# Patient Record
Sex: Female | Born: 1975 | Race: White | Hispanic: No | Marital: Single | Smoking: Former smoker
Health system: Southern US, Community
[De-identification: ages and names within clinical notes are randomized; demographics above are authoritative.]

## PROBLEM LIST (undated history)

## (undated) DIAGNOSIS — M5416 Radiculopathy, lumbar region: Secondary | ICD-10-CM

## (undated) DIAGNOSIS — T8859XA Other complications of anesthesia, initial encounter: Secondary | ICD-10-CM

## (undated) DIAGNOSIS — IMO0002 Reserved for concepts with insufficient information to code with codable children: Secondary | ICD-10-CM

## (undated) DIAGNOSIS — F419 Anxiety disorder, unspecified: Secondary | ICD-10-CM

## (undated) DIAGNOSIS — I10 Essential (primary) hypertension: Secondary | ICD-10-CM

## (undated) DIAGNOSIS — J45909 Unspecified asthma, uncomplicated: Secondary | ICD-10-CM

## (undated) DIAGNOSIS — T7840XA Allergy, unspecified, initial encounter: Secondary | ICD-10-CM

## (undated) DIAGNOSIS — D1800 Hemangioma unspecified site: Secondary | ICD-10-CM

## (undated) DIAGNOSIS — F40240 Claustrophobia: Secondary | ICD-10-CM

## (undated) DIAGNOSIS — M542 Cervicalgia: Secondary | ICD-10-CM

## (undated) DIAGNOSIS — F17200 Nicotine dependence, unspecified, uncomplicated: Secondary | ICD-10-CM

## (undated) DIAGNOSIS — K219 Gastro-esophageal reflux disease without esophagitis: Secondary | ICD-10-CM

## (undated) DIAGNOSIS — M25569 Pain in unspecified knee: Secondary | ICD-10-CM

## (undated) DIAGNOSIS — M1990 Unspecified osteoarthritis, unspecified site: Secondary | ICD-10-CM

## (undated) DIAGNOSIS — F411 Generalized anxiety disorder: Secondary | ICD-10-CM

## (undated) DIAGNOSIS — F329 Major depressive disorder, single episode, unspecified: Secondary | ICD-10-CM

## (undated) DIAGNOSIS — E785 Hyperlipidemia, unspecified: Secondary | ICD-10-CM

## (undated) DIAGNOSIS — R079 Chest pain, unspecified: Secondary | ICD-10-CM

## (undated) DIAGNOSIS — F32A Depression, unspecified: Secondary | ICD-10-CM

## (undated) DIAGNOSIS — T4145XA Adverse effect of unspecified anesthetic, initial encounter: Secondary | ICD-10-CM

## (undated) DIAGNOSIS — E559 Vitamin D deficiency, unspecified: Secondary | ICD-10-CM

## (undated) DIAGNOSIS — M549 Dorsalgia, unspecified: Secondary | ICD-10-CM

## (undated) HISTORY — DX: Reserved for concepts with insufficient information to code with codable children: IMO0002

## (undated) HISTORY — PX: CHOLECYSTECTOMY: SHX55

## (undated) HISTORY — DX: Hyperlipidemia, unspecified: E78.5

## (undated) HISTORY — DX: Pain in unspecified knee: M25.569

## (undated) HISTORY — PX: TUBAL LIGATION: SHX77

## (undated) HISTORY — DX: Hemangioma unspecified site: D18.00

## (undated) HISTORY — DX: Cervicalgia: M54.2

## (undated) HISTORY — PX: OVARIAN CYST REMOVAL: SHX89

## (undated) HISTORY — DX: Dorsalgia, unspecified: M54.9

## (undated) HISTORY — DX: Unspecified asthma, uncomplicated: J45.909

## (undated) HISTORY — DX: Anxiety disorder, unspecified: F41.9

## (undated) HISTORY — DX: Unspecified osteoarthritis, unspecified site: M19.90

## (undated) HISTORY — DX: Nicotine dependence, unspecified, uncomplicated: F17.200

## (undated) HISTORY — DX: Chest pain, unspecified: R07.9

## (undated) HISTORY — DX: Essential (primary) hypertension: I10

## (undated) HISTORY — DX: Major depressive disorder, single episode, unspecified: F32.9

## (undated) HISTORY — DX: Generalized anxiety disorder: F41.1

## (undated) HISTORY — PX: ABDOMINAL HYSTERECTOMY: SHX81

## (undated) HISTORY — DX: Gastro-esophageal reflux disease without esophagitis: K21.9

## (undated) HISTORY — DX: Allergy, unspecified, initial encounter: T78.40XA

## (undated) HISTORY — DX: Depression, unspecified: F32.A

## (undated) HISTORY — DX: Radiculopathy, lumbar region: M54.16

---

## 1998-10-14 ENCOUNTER — Inpatient Hospital Stay (HOSPITAL_COMMUNITY): Admission: AD | Admit: 1998-10-14 | Discharge: 1998-10-14 | Payer: Self-pay | Admitting: *Deleted

## 2000-12-11 ENCOUNTER — Encounter: Payer: Self-pay | Admitting: Emergency Medicine

## 2000-12-11 ENCOUNTER — Emergency Department (HOSPITAL_COMMUNITY): Admission: EM | Admit: 2000-12-11 | Discharge: 2000-12-11 | Payer: Self-pay | Admitting: Emergency Medicine

## 2001-01-06 ENCOUNTER — Ambulatory Visit (HOSPITAL_COMMUNITY): Admission: RE | Admit: 2001-01-06 | Discharge: 2001-01-06 | Payer: Self-pay | Admitting: *Deleted

## 2001-01-06 ENCOUNTER — Encounter: Payer: Self-pay | Admitting: Obstetrics and Gynecology

## 2001-01-06 ENCOUNTER — Other Ambulatory Visit: Admission: RE | Admit: 2001-01-06 | Discharge: 2001-01-06 | Payer: Self-pay | Admitting: Obstetrics and Gynecology

## 2001-05-27 ENCOUNTER — Emergency Department (HOSPITAL_COMMUNITY): Admission: EM | Admit: 2001-05-27 | Discharge: 2001-05-27 | Payer: Self-pay | Admitting: *Deleted

## 2001-12-24 ENCOUNTER — Encounter: Payer: Self-pay | Admitting: *Deleted

## 2001-12-24 ENCOUNTER — Emergency Department (HOSPITAL_COMMUNITY): Admission: EM | Admit: 2001-12-24 | Discharge: 2001-12-24 | Payer: Self-pay | Admitting: *Deleted

## 2002-05-27 ENCOUNTER — Emergency Department (HOSPITAL_COMMUNITY): Admission: EM | Admit: 2002-05-27 | Discharge: 2002-05-27 | Payer: Self-pay | Admitting: Emergency Medicine

## 2002-10-27 ENCOUNTER — Emergency Department (HOSPITAL_COMMUNITY): Admission: EM | Admit: 2002-10-27 | Discharge: 2002-10-28 | Payer: Self-pay | Admitting: Internal Medicine

## 2002-10-28 ENCOUNTER — Encounter: Payer: Self-pay | Admitting: Internal Medicine

## 2002-12-24 ENCOUNTER — Inpatient Hospital Stay (HOSPITAL_COMMUNITY): Admission: RE | Admit: 2002-12-24 | Discharge: 2002-12-26 | Payer: Self-pay | Admitting: Obstetrics and Gynecology

## 2004-03-29 ENCOUNTER — Emergency Department (HOSPITAL_COMMUNITY): Admission: EM | Admit: 2004-03-29 | Discharge: 2004-03-29 | Payer: Self-pay | Admitting: Emergency Medicine

## 2005-10-13 ENCOUNTER — Ambulatory Visit (HOSPITAL_COMMUNITY): Admission: RE | Admit: 2005-10-13 | Discharge: 2005-10-13 | Payer: Self-pay | Admitting: Obstetrics and Gynecology

## 2006-02-28 ENCOUNTER — Emergency Department (HOSPITAL_COMMUNITY): Admission: EM | Admit: 2006-02-28 | Discharge: 2006-02-28 | Payer: Self-pay | Admitting: Emergency Medicine

## 2006-06-29 ENCOUNTER — Emergency Department (HOSPITAL_COMMUNITY): Admission: EM | Admit: 2006-06-29 | Discharge: 2006-06-29 | Payer: Self-pay | Admitting: Emergency Medicine

## 2006-07-04 ENCOUNTER — Emergency Department (HOSPITAL_COMMUNITY): Admission: EM | Admit: 2006-07-04 | Discharge: 2006-07-04 | Payer: Self-pay | Admitting: Emergency Medicine

## 2007-08-23 ENCOUNTER — Emergency Department (HOSPITAL_COMMUNITY): Admission: EM | Admit: 2007-08-23 | Discharge: 2007-08-23 | Payer: Self-pay | Admitting: Emergency Medicine

## 2007-09-21 ENCOUNTER — Emergency Department (HOSPITAL_COMMUNITY): Admission: EM | Admit: 2007-09-21 | Discharge: 2007-09-21 | Payer: Self-pay | Admitting: Emergency Medicine

## 2007-11-13 ENCOUNTER — Emergency Department (HOSPITAL_COMMUNITY): Admission: EM | Admit: 2007-11-13 | Discharge: 2007-11-13 | Payer: Self-pay | Admitting: Emergency Medicine

## 2007-11-28 ENCOUNTER — Ambulatory Visit (HOSPITAL_COMMUNITY): Admission: RE | Admit: 2007-11-28 | Discharge: 2007-11-28 | Payer: Self-pay | Admitting: Orthopaedic Surgery

## 2008-03-01 ENCOUNTER — Encounter: Payer: Self-pay | Admitting: Orthopedic Surgery

## 2008-05-24 DIAGNOSIS — R079 Chest pain, unspecified: Secondary | ICD-10-CM

## 2008-05-24 HISTORY — DX: Chest pain, unspecified: R07.9

## 2008-06-11 ENCOUNTER — Emergency Department (HOSPITAL_COMMUNITY): Admission: EM | Admit: 2008-06-11 | Discharge: 2008-06-11 | Payer: Self-pay | Admitting: Emergency Medicine

## 2008-06-13 ENCOUNTER — Ambulatory Visit (HOSPITAL_COMMUNITY): Admission: RE | Admit: 2008-06-13 | Discharge: 2008-06-13 | Payer: Self-pay | Admitting: Family Medicine

## 2008-06-18 ENCOUNTER — Ambulatory Visit (HOSPITAL_COMMUNITY): Admission: RE | Admit: 2008-06-18 | Discharge: 2008-06-18 | Payer: Self-pay | Admitting: Family Medicine

## 2009-01-29 ENCOUNTER — Ambulatory Visit: Payer: Self-pay | Admitting: Cardiology

## 2009-01-30 ENCOUNTER — Inpatient Hospital Stay (HOSPITAL_COMMUNITY): Admission: AD | Admit: 2009-01-30 | Discharge: 2009-01-31 | Payer: Self-pay | Admitting: Cardiology

## 2009-01-30 ENCOUNTER — Ambulatory Visit: Payer: Self-pay | Admitting: Cardiovascular Disease

## 2009-01-31 DIAGNOSIS — M79609 Pain in unspecified limb: Secondary | ICD-10-CM

## 2009-01-31 HISTORY — PX: CARDIAC CATHETERIZATION: SHX172

## 2009-02-03 ENCOUNTER — Encounter: Payer: Self-pay | Admitting: Cardiovascular Disease

## 2009-02-03 ENCOUNTER — Ambulatory Visit: Payer: Self-pay | Admitting: Vascular Surgery

## 2009-02-03 ENCOUNTER — Emergency Department (HOSPITAL_COMMUNITY): Admission: EM | Admit: 2009-02-03 | Discharge: 2009-02-03 | Payer: Self-pay | Admitting: Emergency Medicine

## 2009-02-03 ENCOUNTER — Telehealth: Payer: Self-pay | Admitting: Cardiovascular Disease

## 2009-02-04 ENCOUNTER — Telehealth: Payer: Self-pay | Admitting: Cardiovascular Disease

## 2009-02-04 ENCOUNTER — Encounter (INDEPENDENT_AMBULATORY_CARE_PROVIDER_SITE_OTHER): Payer: Self-pay

## 2009-02-05 ENCOUNTER — Telehealth: Payer: Self-pay | Admitting: Cardiovascular Disease

## 2009-02-05 DIAGNOSIS — M25569 Pain in unspecified knee: Secondary | ICD-10-CM | POA: Insufficient documentation

## 2009-02-05 DIAGNOSIS — F17200 Nicotine dependence, unspecified, uncomplicated: Secondary | ICD-10-CM

## 2009-02-05 DIAGNOSIS — Z8659 Personal history of other mental and behavioral disorders: Secondary | ICD-10-CM

## 2009-02-05 DIAGNOSIS — R079 Chest pain, unspecified: Secondary | ICD-10-CM

## 2009-02-05 DIAGNOSIS — Z8679 Personal history of other diseases of the circulatory system: Secondary | ICD-10-CM | POA: Insufficient documentation

## 2009-02-11 ENCOUNTER — Ambulatory Visit: Payer: Self-pay | Admitting: Cardiovascular Disease

## 2009-02-20 ENCOUNTER — Ambulatory Visit: Payer: Self-pay | Admitting: Cardiovascular Disease

## 2009-07-04 ENCOUNTER — Ambulatory Visit (HOSPITAL_COMMUNITY): Admission: RE | Admit: 2009-07-04 | Discharge: 2009-07-04 | Payer: Self-pay | Admitting: Family Medicine

## 2009-10-06 ENCOUNTER — Ambulatory Visit (HOSPITAL_COMMUNITY): Admission: RE | Admit: 2009-10-06 | Discharge: 2009-10-06 | Payer: Self-pay | Admitting: Family Medicine

## 2010-06-02 ENCOUNTER — Ambulatory Visit (HOSPITAL_COMMUNITY)
Admission: RE | Admit: 2010-06-02 | Discharge: 2010-06-02 | Payer: Self-pay | Source: Home / Self Care | Attending: Family Medicine | Admitting: Family Medicine

## 2010-06-14 ENCOUNTER — Encounter: Payer: Self-pay | Admitting: Family Medicine

## 2010-07-07 ENCOUNTER — Emergency Department (HOSPITAL_COMMUNITY)
Admission: EM | Admit: 2010-07-07 | Discharge: 2010-07-07 | Disposition: A | Discharge status: Home / Self Care | Payer: Medicaid Other | Attending: Emergency Medicine | Admitting: Emergency Medicine

## 2010-07-07 DIAGNOSIS — IMO0002 Reserved for concepts with insufficient information to code with codable children: Secondary | ICD-10-CM | POA: Insufficient documentation

## 2010-07-07 DIAGNOSIS — X503XXA Overexertion from repetitive movements, initial encounter: Secondary | ICD-10-CM | POA: Insufficient documentation

## 2010-07-14 ENCOUNTER — Other Ambulatory Visit (HOSPITAL_COMMUNITY): Payer: Self-pay | Admitting: Family Medicine

## 2010-07-14 DIAGNOSIS — M5090 Cervical disc disorder, unspecified, unspecified cervical region: Secondary | ICD-10-CM

## 2010-07-14 DIAGNOSIS — M542 Cervicalgia: Secondary | ICD-10-CM

## 2010-07-15 ENCOUNTER — Ambulatory Visit (HOSPITAL_COMMUNITY)
Admission: RE | Admit: 2010-07-15 | Discharge: 2010-07-15 | Disposition: A | Discharge status: Home / Self Care | Payer: Medicaid Other | Source: Ambulatory Visit | Attending: Family Medicine | Admitting: Family Medicine

## 2010-07-15 DIAGNOSIS — M5380 Other specified dorsopathies, site unspecified: Secondary | ICD-10-CM | POA: Insufficient documentation

## 2010-07-15 DIAGNOSIS — M542 Cervicalgia: Secondary | ICD-10-CM | POA: Insufficient documentation

## 2010-07-15 DIAGNOSIS — M79609 Pain in unspecified limb: Secondary | ICD-10-CM | POA: Insufficient documentation

## 2010-07-15 DIAGNOSIS — M5020 Other cervical disc displacement, unspecified cervical region: Secondary | ICD-10-CM | POA: Insufficient documentation

## 2010-07-15 DIAGNOSIS — M5090 Cervical disc disorder, unspecified, unspecified cervical region: Secondary | ICD-10-CM

## 2010-08-04 ENCOUNTER — Ambulatory Visit (HOSPITAL_COMMUNITY): Payer: Medicaid Other | Admitting: Physical Therapy

## 2010-08-04 DIAGNOSIS — M542 Cervicalgia: Secondary | ICD-10-CM | POA: Insufficient documentation

## 2010-08-04 DIAGNOSIS — IMO0001 Reserved for inherently not codable concepts without codable children: Secondary | ICD-10-CM | POA: Insufficient documentation

## 2010-08-04 DIAGNOSIS — M6281 Muscle weakness (generalized): Secondary | ICD-10-CM | POA: Insufficient documentation

## 2010-08-13 ENCOUNTER — Ambulatory Visit (HOSPITAL_COMMUNITY)
Admission: RE | Admit: 2010-08-13 | Discharge: 2010-08-13 | Disposition: A | Discharge status: Home / Self Care | Payer: Medicaid Other | Source: Ambulatory Visit | Attending: Neurosurgery | Admitting: Neurosurgery

## 2010-08-18 ENCOUNTER — Ambulatory Visit (HOSPITAL_COMMUNITY): Payer: Medicaid Other | Admitting: *Deleted

## 2010-08-20 ENCOUNTER — Ambulatory Visit (HOSPITAL_COMMUNITY): Payer: Medicaid Other

## 2010-08-25 ENCOUNTER — Ambulatory Visit (HOSPITAL_COMMUNITY): Payer: Medicaid Other

## 2010-08-27 ENCOUNTER — Ambulatory Visit (HOSPITAL_COMMUNITY): Payer: Medicaid Other | Admitting: *Deleted

## 2010-08-28 LAB — BASIC METABOLIC PANEL
Calcium: 8.5 mg/dL (ref 8.4–10.5)
GFR calc Af Amer: >60 mL/min (ref >60)
GFR calc non Af Amer: >60 mL/min (ref >60)
Sodium: 138 mEq/L (ref 135–145)

## 2010-08-28 LAB — LIPID PANEL
Cholesterol: 172 mg/dL (ref 0–200)
Triglycerides: 89 mg/dL (ref <150)
VLDL: 18 mg/dL (ref 0–40)

## 2010-08-28 LAB — PROTIME-INR: INR: 1.1 (ref 0.00–1.49)

## 2010-09-07 LAB — BASIC METABOLIC PANEL
BUN: 7 mg/dL (ref 6–23)
CO2: 26 mEq/L (ref 19–32)
Chloride: 106 mEq/L (ref 96–112)
GFR calc non Af Amer: >60 mL/min (ref >60)
Glucose, Bld: 87 mg/dL (ref 70–99)
Potassium: 3.8 mEq/L (ref 3.5–5.1)
Sodium: 139 mEq/L (ref 135–145)

## 2010-09-07 LAB — URINALYSIS, ROUTINE W REFLEX MICROSCOPIC
Bilirubin Urine: NEGATIVE
Glucose, UA: NEGATIVE mg/dL
Ketones, ur: NEGATIVE mg/dL
Leukocytes, UA: NEGATIVE
Specific Gravity, Urine: 1.01 (ref 1.005–1.030)
Urobilinogen, UA: 0.2 mg/dL (ref 0.0–1.0)
pH: 7.5 (ref 5.0–8.0)

## 2010-09-07 LAB — CBC
MCV: 92.2 fL (ref 78.0–100.0)
RDW: 13 % (ref 11.5–15.5)

## 2010-09-07 LAB — DIFFERENTIAL
Basophils Absolute: 0 10*3/uL (ref 0.0–0.1)
Eosinophils Absolute: 0.1 10*3/uL (ref 0.0–0.7)
Lymphs Abs: 1.1 10*3/uL (ref 0.7–4.0)
Monocytes Absolute: 0.3 10*3/uL (ref 0.1–1.0)
Monocytes Relative: 6 % (ref 3–12)
Neutrophils Relative %: 70 % (ref 43–77)

## 2010-09-07 LAB — POCT CARDIAC MARKERS
CKMB, poc: <1 ng/mL — ABNORMAL LOW (ref 1.0–8.0)
Troponin i, poc: <0.05 ng/mL (ref 0.00–0.09)

## 2010-09-07 LAB — PROTIME-INR: Prothrombin Time: 14.3 seconds (ref 11.6–15.2)

## 2010-10-09 NOTE — Discharge Summary (Signed)
   NAMESHERMAINE, Caitlyn Ray                         ACCOUNT NO.:  [id number]   MEDICAL RECORD NO.:  [id number]                   PATIENT TYPE:  INP   LOCATION:  A427                                 FACILITY:  [facility]   PHYSICIAN:  Tilda Burrow, M.D.              DATE OF BIRTH:  [birth year]   DATE OF ADMISSION:  12/24/2002  DATE OF DISCHARGE:  12/26/2002                                 DISCHARGE SUMMARY   ADMISSION DIAGNOSES:  1. Pelvic pain.  2. Uterine retroversion.  3. Mild cervical dysplasia.   DISCHARGE DIAGNOSES:  1. Pelvic pain.  2. Uterine retroversion.  3. Mild cervical dysplasia.   PROCEDURE:  On December 24, 2002, vaginal hysterectomy.   DISCHARGE MEDICATIONS:  1. Tylox one q.4h. p.r.n. pain., dispense 15.  2. Motrin 200 mg two q.4h. p.r.n. pain.  3. Laxative of choice p.r.n.   SPECIAL INSTRUCTIONS:  Temperature check twice daily with routine postop  instructions.   HOSPITAL COURSE:  This 35 year old female was admitted for vaginal  hysterectomy for the aforementioned complaints.  The patient underwent a  straight forward vaginal hysterectomy with reservation of ovaries on December 24, 2002, with 100 cc blood loss.  The patient sought and received a regular  diet that evening, but then became nauseated.  She had mild vomiting.  There  was some increased amount of discomfort after the vomiting episode.  Postoperative hemoglobin was 13.0, hematocrit 37.2 as compared to the  admitting hemoglobin of 14.7 and 42.2, not unreasonable for the surgical  procedure involved.  She was kept an additional 24 hours and discharged on  December 26, 2002, doing well, active, ambulatory and afebrile with resumption  of passage of gas and normal bowel function.  She will be discharged and  followed up on January 04, 2003.  The patient wishes early return to her job  at a [employer] in [place].  Will consider no less than a two-week  postpartum out of work status.                       Tilda Burrow, M.D.    JVF/MEDQ  D:  12/26/2002  T:  12/26/2002  Job:  [id number]

## 2010-10-09 NOTE — H&P (Signed)
NAME:  Caitlyn Ray, Caitlyn Ray                         ACCOUNT NO.:  [id number]   MEDICAL RECORD NO.:  [id number]                   PATIENT TYPE:  AMB   LOCATION:  DAY                                  FACILITY:  [facility]   PHYSICIAN:  Tilda Burrow, M.D.              DATE OF BIRTH:  1976-04-14   DATE OF ADMISSION:  12/24/2002  DATE OF DISCHARGE:                                HISTORY & PHYSICAL   ADMISSION DIAGNOSES:  1. Pelvic pain.  2. Uterine retroversion.  3. Mild cervical dysplasia.   HISTORY OF PRESENT ILLNESS:  This 35 year old female status post tubal  ligation and no plans for future childbearing is admitted at this time for  vaginal hysterectomy with preservation of ovaries.  She missed work three  days of the last cycle.  Menses increased dramatically since her tubal  ligation in 2001.  She now has menses lasting three to nine days.  She has  longstanding history of uterine retroversion and dysmenorrhea.  Pain has  been present for greater than a year.   She was seen in the emergency room once for right lower quadrant pain  recently which was considered of GYN origin.  CT scan at that time showed  small symptomatic ovarian cysts.  The uterus, however, is more significantly  retroverted.  On followup exam in the office, the adnexa were nontender.  The uterus was bulbous, retroverted, 2+ tender to contact anywhere along it.  At the time of that visit, she was 21 days into her cycle and then was in  the luteal phase.  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  In discussion of options, I made it clear that  hysterectomy was the patient's preferred choice if colposcopy was deferred.  There is no significant suspicion of cervical neoplasia.   PAST MEDICAL HISTORY:  None.   ALLERGIES:  ASPIRIN causes nausea and vomiting.  PENICILLIN causes rash.   PAST SURGICAL HISTORY:  Tubal ligation and laparoscopic right ovarian  cystectomy, Dr.  Lisette Grinder.   Note to Anesthesia:  The patient has a claustrophobic response to mask  oxygenation and requests that mask not be used during recovery due to that  claustrophobic feeling.   Risks of the procedure and technical aspect using [employer]  Booklet as visual guide with specific mention of bleeding, infection, injury  to adjacent organs such as bladder, ureter, or bowel has been discussed.  The patient understands that. She signed Medicaid consent forms  acknowledging the inability to childbear after hysterectomy.   PHYSICAL EXAMINATION:  VITAL SIGNS:  Height 5 feet 9-1/2 inches, weight 200.  Blood pressure 124/72, pulse 70.  HEENT:  Pupils are equal, round, and reactive.  NECK:  Supple.  Trachea midline.  CHEST:  Clear to auscultation.  ABDOMEN:  Nontender.  Moderate obesity.  PELVIC:  External genitalia good perineal support.  Vaginal exam: Posterior  oriented vagina with bulbous retroverted cervix and uterus.  No adnexal  masses.  Uterus tender to contact.   LABORATORY DATA:  Vaginal ultrasound shows small, normal, functional cyst  within the ovaries.  Adnexa otherwise negative.   PLAN:  Vaginal hysterectomy with preservation of ovaries December 24, 2002.                                               Tilda Burrow, M.D.    JVF/MEDQ  D:  12/21/2002  T:  12/21/2002  Job:  [id number]

## 2010-10-09 NOTE — Op Note (Signed)
Caitlyn Ray, Caitlyn Ray                         ACCOUNT NO.:  [id number]   MEDICAL RECORD NO.:  [id number]                   PATIENT TYPE:  INP   LOCATION:  A427                                 FACILITY:  [facility]   PHYSICIAN:  Tilda Burrow, M.D.              DATE OF BIRTH:  1976/02/08   DATE OF PROCEDURE:  12/24/2002  DATE OF DISCHARGE:  12/26/2002                                 OPERATIVE REPORT   PREOPERATIVE DIAGNOSES:  1. Pelvic pain.  2. Uterine retroversion.  3. Mild cervical dysplasia.   POSTOPERATIVE DIAGNOSES:  1. Pelvic pain.  2. Uterine retroversion.  3. Mild cervical dysplasia.   PROCEDURE:  Vaginal hysterectomy.   SURGEON:  Tilda Burrow, M.D.   ASSISTANTS:  Nino Parsley.   ANESTHESIA:  General.   COMPLICATIONS:  None.   FINDINGS:  Lax pelvic structures.  Marked descent of tubes and ovaries.   ESTIMATED BLOOD LOSS:  100 mL.   DESCRIPTION OF PROCEDURE:  The patient was taken to the operating room and  prepped and draped for a vaginal procedure.  Anterior and posterior  colpotomy were performed without difficulty with Marcaine and epinephrine  infiltrated into the anterior cervicovaginal fornix to improve hemostasis.  The long weighted speculum was inserted in the posterior colpotomy incision  and the uterosacral ligaments on either side clamped, cut, and suture  ligated using curved blunt clamps, Mayo scissors, and 0 chromic suture  ligature with tagging of the pedicles.  The lower cardinal ligaments were  then taken down, clamped, cut, and suture ligated on either side with  Zeppelin clamps, Mayo scissors, and 0 chromic suture ligatures described  earlier.  At this point we marched up the upper cardinal ligaments and the  broad ligament in serial small bites using similar technique.  Upon reaching  the level of the utero-ovarian ligament and round ligament, we were able to  grasp the pedicle on each side, amputate the uterus, and easily access  these  pedicles for double ligature.  The ovaries tended to fall down in place  markedly.  We made sure that the pedicles were able to retract upward and  after inspection for hemostasis, they were released and allowed to pull  further up into the abdomen.  The long speculum was converted to the three-  inch speculum, the uterosacral ligaments pulled together in the midline with  a suture of 3-0 nylon, the pelvic peritoneum closed with continuous running  2-0 chromic above the uterosacral ligament suture, then the uterosacral  ligament suture tied down and then the cuff closed in the midline, pulling  the cardinal ligament remnants together in the midline.  The anterior and  posterior edge of the hysterectomy incision were trimmed to result in a  smoother vaginal apex.  The vaginal cuff support was excellent, hemostasis  was sufficient that packing was not necessary.  A Foley catheter was  inserted.  Tilda Burrow, M.D.    JVF/MEDQ  D:  12/26/2002  T:  12/26/2002  Job:  [id number]

## 2010-12-23 ENCOUNTER — Other Ambulatory Visit: Payer: Self-pay

## 2010-12-23 ENCOUNTER — Encounter: Payer: Self-pay | Admitting: *Deleted

## 2010-12-23 DIAGNOSIS — Z9079 Acquired absence of other genital organ(s): Secondary | ICD-10-CM | POA: Insufficient documentation

## 2010-12-23 DIAGNOSIS — R1013 Epigastric pain: Secondary | ICD-10-CM | POA: Insufficient documentation

## 2010-12-23 DIAGNOSIS — R079 Chest pain, unspecified: Secondary | ICD-10-CM | POA: Insufficient documentation

## 2010-12-23 DIAGNOSIS — F17200 Nicotine dependence, unspecified, uncomplicated: Secondary | ICD-10-CM | POA: Insufficient documentation

## 2010-12-23 DIAGNOSIS — R61 Generalized hyperhidrosis: Secondary | ICD-10-CM | POA: Insufficient documentation

## 2010-12-23 NOTE — ED Notes (Signed)
Pain started while at work

## 2010-12-24 ENCOUNTER — Emergency Department (HOSPITAL_COMMUNITY)
Admission: EM | Admit: 2010-12-24 | Discharge: 2010-12-24 | Disposition: A | Discharge status: Home / Self Care | Payer: Self-pay | Attending: Emergency Medicine | Admitting: Emergency Medicine

## 2010-12-24 ENCOUNTER — Emergency Department (HOSPITAL_COMMUNITY): Payer: Self-pay

## 2010-12-24 DIAGNOSIS — R1013 Epigastric pain: Secondary | ICD-10-CM

## 2010-12-24 LAB — CBC
Hemoglobin: 13.8 g/dL (ref 12.0–15.0)
MCHC: 34.8 g/dL (ref 30.0–36.0)
WBC: 8.1 10*3/uL (ref 4.0–10.5)

## 2010-12-24 LAB — COMPREHENSIVE METABOLIC PANEL
BUN: 10 mg/dL (ref 6–23)
Calcium: 9.9 mg/dL (ref 8.4–10.5)
GFR calc Af Amer: >60 mL/min (ref >60)
Glucose, Bld: 109 mg/dL — ABNORMAL HIGH (ref 70–99)
Total Protein: 7.5 g/dL (ref 6.0–8.3)

## 2010-12-24 LAB — URINALYSIS, ROUTINE W REFLEX MICROSCOPIC
Leukocytes, UA: NEGATIVE
Nitrite: NEGATIVE
Specific Gravity, Urine: 1.025 (ref 1.005–1.030)
pH: 6 (ref 5.0–8.0)

## 2010-12-24 LAB — URINE MICROSCOPIC-ADD ON

## 2010-12-24 LAB — CARDIAC PANEL(CRET KIN+CKTOT+MB+TROPI)
CK, MB: 1.5 ng/mL (ref 0.3–4.0)
Troponin I: <0.3 ng/mL (ref <0.30)

## 2010-12-24 LAB — DIFFERENTIAL
Basophils Absolute: 0.1 10*3/uL (ref 0.0–0.1)
Basophils Relative: 1 % (ref 0–1)
Lymphocytes Relative: 37 % (ref 12–46)
Monocytes Relative: 6 % (ref 3–12)
Neutro Abs: 4.4 10*3/uL (ref 1.7–7.7)
Neutrophils Relative %: 54 % (ref 43–77)

## 2010-12-24 MED ORDER — ONDANSETRON HCL 4 MG/2ML IJ SOLN
4.0000 mg | Freq: Once | INTRAMUSCULAR | Status: AC
  Administered 2010-12-24: 4 mg via INTRAVENOUS
  Filled 2010-12-24: qty 2

## 2010-12-24 MED ORDER — TRAMADOL HCL 50 MG PO TABS: 50.0000 mg | ORAL_TABLET | Freq: Four times a day (QID) | ORAL | Status: AC | PRN

## 2010-12-24 MED ORDER — SODIUM CHLORIDE 0.9 % IV SOLN
Freq: Once | INTRAVENOUS | Status: AC
  Administered 2010-12-24: 02:00:00 via INTRAVENOUS

## 2010-12-24 MED ORDER — IOHEXOL 300 MG/ML  SOLN
100.0000 mL | Freq: Once | INTRAMUSCULAR | Status: AC | PRN
  Administered 2010-12-24: 100 mL via INTRAVENOUS

## 2010-12-24 MED ORDER — HYDROMORPHONE HCL 1 MG/ML IJ SOLN
1.0000 mg | Freq: Once | INTRAMUSCULAR | Status: AC
  Administered 2010-12-24: 1 mg via INTRAVENOUS
  Filled 2010-12-24: qty 1

## 2010-12-24 MED ORDER — RANITIDINE HCL 150 MG PO CAPS: 150.0000 mg | ORAL_CAPSULE | Freq: Two times a day (BID) | ORAL | Status: DC

## 2010-12-24 NOTE — ED Provider Notes (Addendum)
History     CSN: [id number] Arrival date & time: 12/24/2010 12:09 AM  Chief Complaint  Patient presents with  . Chest Pain    radiates to back   Patient is a 35 y.o. female presenting with chest pain. The history is provided by the patient.  Chest Pain The chest pain began yesterday (The patient complains of epigastric pain going into her back). Chest pain occurs frequently. The chest pain is unchanged. At its most intense, the pain is at 4/10. The pain is currently at 4/10. The quality of the pain is described as aching. Chest pain is worsened by certain positions. Primary symptoms include abdominal pain. Pertinent negatives for primary symptoms include no fever, no fatigue and no cough.  Associated symptoms include diaphoresis.  Pertinent negatives for past medical history include no seizures.     History reviewed. No pertinent past medical history.  Past Surgical History  Procedure Date  . Abdominal hysterectomy     History reviewed. No pertinent family history.  History  Substance Use Topics  . Smoking status: Current Everyday Smoker -- 1.0 packs/day    Types: Cigarettes  . Smokeless tobacco: Not on file  . Alcohol Use: No    OB History    Grav Para Term Preterm Abortions TAB SAB Ect Mult Living                  Review of Systems  Constitutional: Positive for diaphoresis. Negative for fever and fatigue.  HENT: Negative for congestion, sinus pressure and ear discharge.   Eyes: Negative for discharge.  Respiratory: Negative for cough.   Cardiovascular: Positive for chest pain.  Gastrointestinal: Positive for abdominal pain. Negative for diarrhea.  Genitourinary: Negative for frequency and hematuria.  Musculoskeletal: Negative for back pain.  Skin: Negative for rash.  Neurological: Negative for seizures and headaches.  Hematological: Negative.   Psychiatric/Behavioral: Negative for hallucinations.    Physical Exam  BP 127/78  Pulse 73  Temp(Src) 97.8 F (36.6  C) (Oral)  Resp 16  Ht 5\' 10"  (1.778 m)  Wt 234 lb (106.142 kg)  BMI 33.58 kg/m2  SpO2 95%  Physical Exam  Constitutional: She is oriented to person, place, and time. She appears well-developed.  HENT:  Head: Normocephalic and atraumatic.  Eyes: Conjunctivae and EOM are normal. No scleral icterus.  Neck: Neck supple. No thyromegaly present.  Cardiovascular: Normal rate and regular rhythm.  Exam reveals no gallop and no friction rub.   No murmur heard. Pulmonary/Chest: No stridor. She has no wheezes. She has no rales. She exhibits no tenderness.  Abdominal: She exhibits no distension. There is tenderness. There is no rebound.       Epigastric pain and tendernous  Musculoskeletal: Normal range of motion. She exhibits no edema.  Lymphadenopathy:    She has no cervical adenopathy.  Neurological: She is oriented to person, place, and time. Coordination normal.  Skin: No rash noted. No erythema.  Psychiatric: She has a normal mood and affect. Her behavior is normal.    ED Course  Procedures  MDM Epigastric pain,  Gastritis?  Gall stones?  Results for orders placed during the hospital encounter of 12/24/10  CBC      Component Value Range   WBC 8.1  4.0 - 10.5 (K/uL)   RBC 4.41  3.87 - 5.11 (MIL/uL)   Hemoglobin 13.8  12.0 - 15.0 (g/dL)   HCT 40.9  81.1 - 91.4 (%)   MCV 89.8  78.0 - 100.0 (fL)  MCH 31.3  26.0 - 34.0 (pg)   MCHC 34.8  30.0 - 36.0 (g/dL)   RDW 16.1  09.6 - 04.5 (%)   Platelets 160  150 - 400 (K/uL)  DIFFERENTIAL      Component Value Range   Neutrophils Relative 54  43 - 77 (%)   Neutro Abs 4.4  1.7 - 7.7 (K/uL)   Lymphocytes Relative 37  12 - 46 (%)   Lymphs Abs 3.0  0.7 - 4.0 (K/uL)   Monocytes Relative 6  3 - 12 (%)   Monocytes Absolute 0.5  0.1 - 1.0 (K/uL)   Eosinophils Relative 3  0 - 5 (%)   Eosinophils Absolute 0.2  0.0 - 0.7 (K/uL)   Basophils Relative 1  0 - 1 (%)   Basophils Absolute 0.1  0.0 - 0.1 (K/uL)  CARDIAC PANEL(CRET  KIN+CKTOT+MB+TROPI)      Component Value Range   Total CK 73  7 - 177 (U/L)   CK, MB 1.5  0.3 - 4.0 (ng/mL)   Troponin I <0.30  <0.30 (ng/mL)   Relative Index RELATIVE INDEX IS INVALID  0.0 - 2.5   LIPASE, BLOOD      Component Value Range   Lipase 36  11 - 59 (U/L)  COMPREHENSIVE METABOLIC PANEL      Component Value Range   Sodium 138  135 - 145 (mEq/L)   Potassium 3.7  3.5 - 5.1 (mEq/L)   Chloride 101  96 - 112 (mEq/L)   CO2 23  19 - 32 (mEq/L)   Glucose, Bld 109 (*) 70 - 99 (mg/dL)   BUN 10  6 - 23 (mg/dL)   Creatinine, Ser 4.09  0.50 - 1.10 (mg/dL)   Calcium 9.9  8.4 - 81.1 (mg/dL)   Total Protein 7.5  6.0 - 8.3 (g/dL)   Albumin 4.0  3.5 - 5.2 (g/dL)   AST 12  0 - 37 (U/L)   ALT 8  0 - 35 (U/L)   Alkaline Phosphatase 53  39 - 117 (U/L)   Total Bilirubin 0.4  0.3 - 1.2 (mg/dL)   GFR calc non Af Amer >60  >60 (mL/min)   GFR calc Af Amer >60  >60 (mL/min)  URINALYSIS, ROUTINE W REFLEX MICROSCOPIC      Component Value Range   Color, Urine AMBER (*) YELLOW    Appearance HAZY (*) CLEAR    Specific Gravity, Urine 1.025  1.005 - 1.030    pH 6.0  5.0 - 8.0    Glucose, UA NEGATIVE  NEGATIVE (mg/dL)   Hgb urine dipstick LARGE (*) NEGATIVE    Bilirubin Urine NEGATIVE  NEGATIVE    Ketones, ur NEGATIVE  NEGATIVE (mg/dL)   Protein, ur NEGATIVE  NEGATIVE (mg/dL)   Urobilinogen, UA 0.2  0.0 - 1.0 (mg/dL)   Nitrite NEGATIVE  NEGATIVE    Leukocytes, UA NEGATIVE  NEGATIVE   URINE MICROSCOPIC-ADD ON      Component Value Range   Squamous Epithelial / LPF MANY (*) RARE    WBC, UA 3-6  <3 (WBC/hpf)   RBC / HPF 7-10  <3 (RBC/hpf)   Bacteria, UA MANY (*) RARE    Ct Abdomen Pelvis W Contrast  12/24/2010  *RADIOLOGY REPORT*  Clinical Data: Chest pain  CT ABDOMEN AND PELVIS WITH CONTRAST  Technique:  Multidetector CT imaging of the abdomen and pelvis was performed following the standard protocol during bolus administration of intravenous contrast.  Contrast: 100 ml Omnipaque-300 IV  Comparison:  12/24/2001  Findings: Lung bases are clear.  Liver and gallbladder are normal. Pancreas and spleen are normal.  Kidneys are normal.  Negative for bowel obstruction or bowel thickening.  Appendix is normal.  No free fluid.  Negative for mass or adenopathy.  IMPRESSION: No acute abnormality.  Original Report Authenticated By: Camelia Phenes, M.D.   Dg Chest Portable 1 View  12/24/2010  *RADIOLOGY REPORT*  Clinical Data: Chest pain and short of breath  PORTABLE CHEST - 1 VIEW  Comparison: 06/18/2008  Findings: Heart size is normal and the vascularity is normal. Lungs are clear without infiltrate or effusion.  IMPRESSION: No active cardiopulmonary disease.  Original Report Authenticated By: Camelia Phenes, M.D.       Benny Lennert, MD 12/24/10 0451                              Date: 02/05/2011  Rate: 72  Rhythm: normal sinus rhythm  QRS Axis: normal  Intervals: normal  ST/T Wave abnormalities: normal  Conduction Disutrbances:none  Narrative Interpretation:   Old EKG Reviewed: none available    Benny Lennert, MD 02/05/11 [phone]

## 2010-12-24 NOTE — ED Notes (Signed)
Pt instructed not to drive due to medications given.  Pt verbalizes understanding and will be driven home by her daughter.

## 2011-02-16 LAB — BASIC METABOLIC PANEL
BUN: 6
CO2: 27
Chloride: 108
Creatinine, Ser: 0.68
GFR calc Af Amer: >60

## 2011-02-16 LAB — CBC
MCHC: 35.7
MCV: 90.1
RBC: 4.31

## 2011-02-16 LAB — DIFFERENTIAL
Basophils Relative: 1
Eosinophils Absolute: 0.2
Monocytes Relative: 8
Neutrophils Relative %: 58

## 2012-03-02 ENCOUNTER — Encounter: Payer: Self-pay | Admitting: Cardiovascular Disease

## 2012-09-12 ENCOUNTER — Other Ambulatory Visit (HOSPITAL_COMMUNITY): Payer: Self-pay | Admitting: Orthopedic Surgery

## 2012-09-12 DIAGNOSIS — M542 Cervicalgia: Secondary | ICD-10-CM

## 2012-09-15 ENCOUNTER — Ambulatory Visit (HOSPITAL_COMMUNITY)
Admission: RE | Admit: 2012-09-15 | Discharge: 2012-09-15 | Disposition: A | Discharge status: Home / Self Care | Payer: No Typology Code available for payment source | Source: Ambulatory Visit | Attending: Orthopedic Surgery | Admitting: Orthopedic Surgery

## 2012-09-15 ENCOUNTER — Other Ambulatory Visit (HOSPITAL_COMMUNITY): Payer: Self-pay | Admitting: Orthopedic Surgery

## 2012-09-15 DIAGNOSIS — M542 Cervicalgia: Secondary | ICD-10-CM

## 2012-09-15 DIAGNOSIS — M5380 Other specified dorsopathies, site unspecified: Secondary | ICD-10-CM | POA: Insufficient documentation

## 2012-09-15 DIAGNOSIS — R5381 Other malaise: Secondary | ICD-10-CM | POA: Insufficient documentation

## 2012-09-15 DIAGNOSIS — R209 Unspecified disturbances of skin sensation: Secondary | ICD-10-CM | POA: Insufficient documentation

## 2012-09-15 DIAGNOSIS — M25519 Pain in unspecified shoulder: Secondary | ICD-10-CM | POA: Insufficient documentation

## 2012-09-15 DIAGNOSIS — G9520 Unspecified cord compression: Secondary | ICD-10-CM

## 2012-11-27 ENCOUNTER — Other Ambulatory Visit (HOSPITAL_COMMUNITY): Payer: Self-pay | Admitting: Orthopedic Surgery

## 2012-11-27 DIAGNOSIS — M25511 Pain in right shoulder: Secondary | ICD-10-CM

## 2012-11-28 ENCOUNTER — Ambulatory Visit (HOSPITAL_COMMUNITY): Payer: No Typology Code available for payment source

## 2012-11-29 ENCOUNTER — Ambulatory Visit (HOSPITAL_COMMUNITY)
Admission: RE | Admit: 2012-11-29 | Discharge: 2012-11-29 | Disposition: A | Discharge status: Home / Self Care | Payer: No Typology Code available for payment source | Source: Ambulatory Visit | Attending: Orthopedic Surgery | Admitting: Orthopedic Surgery

## 2012-11-29 DIAGNOSIS — M25511 Pain in right shoulder: Secondary | ICD-10-CM

## 2012-11-29 DIAGNOSIS — M67919 Unspecified disorder of synovium and tendon, unspecified shoulder: Secondary | ICD-10-CM | POA: Insufficient documentation

## 2012-11-29 DIAGNOSIS — M25519 Pain in unspecified shoulder: Secondary | ICD-10-CM | POA: Insufficient documentation

## 2012-11-29 DIAGNOSIS — M719 Bursopathy, unspecified: Secondary | ICD-10-CM | POA: Insufficient documentation

## 2013-01-24 ENCOUNTER — Encounter (HOSPITAL_COMMUNITY): Payer: Self-pay

## 2013-01-25 NOTE — H&P (Signed)
  Caitlyn Ray is an 37 y.o. female.    Chief Complaint: right shoulder pain  HPI: Pt is a 37 y.o. female complaining of right shoulder pain for multiple months. Pain had continually increased since the beginning. X-rays in the clinic show labral tear and AC arthrosis. Pt has tried various conservative treatments which have failed to alleviate their symptoms, including therapy and injections. Various options are discussed with the patient. Risks, benefits and expectations were discussed with the patient. Patient understand the risks, benefits and expectations and wishes to proceed with surgery.   PCP:  Colette Ribas, MD  D/C Plans:  Home   PMH: Past Medical History  Diagnosis Date  . Chest pain, unspecified   . CVA (cerebral infarction)   . Tobacco use disorder   . Anxiety   . Knee pain     PSH: Past Surgical History  Procedure Laterality Date  . Abdominal hysterectomy    . Ovarian cyst removal      Social History:  reports that she has been smoking Cigarettes.  She has been smoking about 1.00 pack per day. She does not have any smokeless tobacco history on file. She reports that she does not drink alcohol or use illicit drugs.  Allergies:  Allergies  Allergen Reactions  . Aspirin Nausea And Vomiting  . Penicillins Hives  . Vancomycin Itching    Medications: No current facility-administered medications for this encounter.   Current Outpatient Prescriptions  Medication Sig Dispense Refill  . calcium carbonate (TUMS - DOSED IN MG ELEMENTAL CALCIUM) 500 MG chewable tablet Chew 1 tablet by mouth daily.      . cyclobenzaprine (FLEXERIL) 10 MG tablet Take 10 mg by mouth 3 (three) times daily as needed for muscle spasms.      Marland Kitchen HYDROcodone-acetaminophen (NORCO/VICODIN) 5-325 MG per tablet Take 1 tablet by mouth every 6 (six) hours as needed for pain (pain).      Marland Kitchen ibuprofen (ADVIL,MOTRIN) 200 MG tablet Take 600 mg by mouth every 6 (six) hours as needed.        .  nabumetone (RELAFEN) 500 MG tablet Take 500 mg by mouth 2 (two) times daily.        No results found for this or any previous visit (from the past 48 hour(s)). No results found.  ROS: Pain with rom of the right upper extremity  Physical Exam: Alert and oriented 37 y.o. 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 Ranell Patrick at [facility]. Risks benefits and expectations were discussed with the patient. Patient understand risks, benefits and expectations and wishes to proceed.

## 2013-01-29 ENCOUNTER — Encounter (HOSPITAL_COMMUNITY): Payer: Self-pay

## 2013-01-29 ENCOUNTER — Encounter (HOSPITAL_COMMUNITY)
Admission: RE | Admit: 2013-01-29 | Discharge: 2013-01-29 | Disposition: A | Discharge status: Home / Self Care | Payer: No Typology Code available for payment source | Source: Ambulatory Visit | Attending: Orthopedic Surgery | Admitting: Orthopedic Surgery

## 2013-01-29 DIAGNOSIS — Z01818 Encounter for other preprocedural examination: Secondary | ICD-10-CM | POA: Insufficient documentation

## 2013-01-29 DIAGNOSIS — Z01812 Encounter for preprocedural laboratory examination: Secondary | ICD-10-CM | POA: Insufficient documentation

## 2013-01-29 HISTORY — DX: Claustrophobia: F40.240

## 2013-01-29 HISTORY — DX: Adverse effect of unspecified anesthetic, initial encounter: T41.45XA

## 2013-01-29 HISTORY — DX: Other complications of anesthesia, initial encounter: T88.59XA

## 2013-01-29 LAB — CBC
HCT: 39.1 % (ref 36.0–46.0)
Hemoglobin: 14.3 g/dL (ref 12.0–15.0)
WBC: 9.1 10*3/uL (ref 4.0–10.5)

## 2013-01-29 NOTE — Progress Notes (Signed)
Patient informed Nurse that she had a stress test and EKG at [facility] in [place]. Will request records. After which patient informed Nurse that she had a cardiac cath at [facility] with Dr. Excell Seltzer. Patient denied having any acute cardiac or pulmonary issues. Patient stated "Since I've lost weight I feel a lot better....Marland KitchenMarland KitchenI used to be a big girl but I've lost weight."

## 2013-01-29 NOTE — Pre-Procedure Instructions (Signed)
Caitlyn Ray  01/29/2013   Your procedure is scheduled on:  Friday February 02, 2013.  Report to [facility] at 12:20 PM.  Call this number if you have problems the morning of surgery: [phone]   Remember:   Do not eat food or drink liquids after midnight.   Take these medicines the morning of surgery with A SIP OF WATER: Hydrocodone (Vicodin) if needed for pain   Do not wear jewelry, make-up or nail polish.  Do not wear lotions, powders, or perfumes. You may NOT wear deodorant.  Do not shave 48 hours prior to surgery.   Do not bring valuables to the hospital.  [facility] is not responsible for any belongings or valuables.  Contacts, dentures or bridgework may not be worn into surgery.  Leave suitcase in the car. After surgery it may be brought to your room.  For patients admitted to the hospital, checkout time is 11:00 AM the day of discharge.   Patients discharged the day of surgery will not be allowed to drive home.  Name and phone number of your driver: Family/Friend  Special Instructions: Shower using CHG 2 nights before surgery and the night before surgery.  If you shower the day of surgery use CHG.  Use special wash - you have one bottle of CHG for all showers.  You should use approximately 1/3 of the bottle for each shower.   Please read over the following fact sheets that you were given: Pain Booklet, Coughing and Deep Breathing and Surgical Site Infection Prevention

## 2013-01-29 NOTE — Progress Notes (Signed)
Nurse called Dr. Ranell Patrick office and spoke to "[place]" and informed her of patients allergy to Vancomycin. Olegario Messier stated she would send a message to Dr. Ranell Patrick.

## 2013-01-30 ENCOUNTER — Encounter (HOSPITAL_COMMUNITY): Payer: Self-pay

## 2013-01-30 NOTE — Progress Notes (Addendum)
Anesthesia chart review: Patient is a 37 year old female scheduled for right shoulder arthroscopy, subacromial decompression, open biceps tenodesis, open distal clavicle excision on 02/02/2013 by Dr. Ranell Patrick. History includes obesity, smoking, claustrophobia, anxiety, chest pain in 2010 with normal coronaries by cath, hysterectomy, cholecystectomy.  She reported SOB/anxiety with previous surgery related to face mask.  She had an Myoview at [facility] that suggested anterior wall ischemia and subsequently had a cardiac cath on 01/31/09 [facility]) that showed normal coronaries, normal LV function with EF 55-60%, no MR. (Copy of report found in E-chart and also found scanned under Media tab.)   Preoperative CBC noted.  She had normal coronaries by cath four years ago.  She does continue to smoke.  Further evaluation by her assigned anesthesiologist on the day of surgery, but would anticipate that she could proceed as planned.  Velna Ochs [facility]/Anesthesiology Phone [phone] 01/30/2013 10:52 AM

## 2013-02-01 MED ORDER — CLINDAMYCIN PHOSPHATE 900 MG/50ML IV SOLN
900.0000 mg | INTRAVENOUS | Status: AC
  Administered 2013-02-02: 900 mg via INTRAVENOUS
  Filled 2013-02-01: qty 50

## 2013-02-01 NOTE — Progress Notes (Signed)
Pt notified of time change;to arrive at 0915. 

## 2013-02-02 ENCOUNTER — Encounter (HOSPITAL_COMMUNITY): Payer: Self-pay | Admitting: Vascular Surgery

## 2013-02-02 ENCOUNTER — Encounter (HOSPITAL_COMMUNITY)
Admission: RE | Disposition: A | Discharge status: Home / Self Care | Payer: Self-pay | Source: Ambulatory Visit | Attending: Orthopedic Surgery

## 2013-02-02 ENCOUNTER — Ambulatory Visit (HOSPITAL_COMMUNITY): Payer: No Typology Code available for payment source | Admitting: Anesthesiology

## 2013-02-02 ENCOUNTER — Ambulatory Visit (HOSPITAL_COMMUNITY)
Admission: RE | Admit: 2013-02-02 | Discharge: 2013-02-02 | Disposition: A | Discharge status: Home / Self Care | Payer: No Typology Code available for payment source | Source: Ambulatory Visit | Attending: Orthopedic Surgery | Admitting: Orthopedic Surgery

## 2013-02-02 ENCOUNTER — Encounter (HOSPITAL_COMMUNITY): Payer: Self-pay | Admitting: *Deleted

## 2013-02-02 DIAGNOSIS — X58XXXA Exposure to other specified factors, initial encounter: Secondary | ICD-10-CM | POA: Insufficient documentation

## 2013-02-02 DIAGNOSIS — M19019 Primary osteoarthritis, unspecified shoulder: Secondary | ICD-10-CM | POA: Insufficient documentation

## 2013-02-02 DIAGNOSIS — Z79899 Other long term (current) drug therapy: Secondary | ICD-10-CM | POA: Insufficient documentation

## 2013-02-02 DIAGNOSIS — E669 Obesity, unspecified: Secondary | ICD-10-CM | POA: Insufficient documentation

## 2013-02-02 DIAGNOSIS — Z6836 Body mass index (BMI) 36.0-36.9, adult: Secondary | ICD-10-CM | POA: Insufficient documentation

## 2013-02-02 DIAGNOSIS — F17200 Nicotine dependence, unspecified, uncomplicated: Secondary | ICD-10-CM | POA: Insufficient documentation

## 2013-02-02 DIAGNOSIS — M25569 Pain in unspecified knee: Secondary | ICD-10-CM | POA: Insufficient documentation

## 2013-02-02 DIAGNOSIS — S43439A Superior glenoid labrum lesion of unspecified shoulder, initial encounter: Secondary | ICD-10-CM | POA: Insufficient documentation

## 2013-02-02 DIAGNOSIS — F411 Generalized anxiety disorder: Secondary | ICD-10-CM | POA: Insufficient documentation

## 2013-02-02 DIAGNOSIS — Z8673 Personal history of transient ischemic attack (TIA), and cerebral infarction without residual deficits: Secondary | ICD-10-CM | POA: Insufficient documentation

## 2013-02-02 HISTORY — PX: SHOULDER ARTHROSCOPY WITH SUBACROMIAL DECOMPRESSION AND OPEN ROTATOR C: SHX5688

## 2013-02-02 SURGERY — SHOULDER ARTHROSCOPY WITH SUBACROMIAL DECOMPRESSION AND OPEN ROTATOR CUFF REPAIR, OPEN BICEPS TENDON REPAIR
Anesthesia: General | Site: Shoulder | Laterality: Right | Wound class: Clean

## 2013-02-02 MED ORDER — NEOSTIGMINE METHYLSULFATE 1 MG/ML IJ SOLN
INTRAMUSCULAR | Status: DC | PRN
  Administered 2013-02-02: 4 mg via INTRAVENOUS

## 2013-02-02 MED ORDER — BUPIVACAINE-EPINEPHRINE 0.25% -1:200000 IJ SOLN
INTRAMUSCULAR | Status: DC | PRN
  Administered 2013-02-02: 7 mL

## 2013-02-02 MED ORDER — LACTATED RINGERS IV SOLN
INTRAVENOUS | Status: DC
  Administered 2013-02-02: 50 mL/h via INTRAVENOUS
  Administered 2013-02-02: 13:00:00 via INTRAVENOUS

## 2013-02-02 MED ORDER — MEPERIDINE HCL 25 MG/ML IJ SOLN: 6.2500 mg | INTRAMUSCULAR | Status: DC | PRN

## 2013-02-02 MED ORDER — CHLORHEXIDINE GLUCONATE 4 % EX LIQD: 60.0000 mL | Freq: Once | CUTANEOUS | Status: DC

## 2013-02-02 MED ORDER — MIDAZOLAM HCL 2 MG/2ML IJ SOLN
INTRAMUSCULAR | Status: AC
  Administered 2013-02-02: 1.5 mg via INTRAVENOUS
  Filled 2013-02-02: qty 2

## 2013-02-02 MED ORDER — PROMETHAZINE HCL 25 MG/ML IJ SOLN
INTRAMUSCULAR | Status: AC
  Filled 2013-02-02: qty 1

## 2013-02-02 MED ORDER — GLYCOPYRROLATE 0.2 MG/ML IJ SOLN
INTRAMUSCULAR | Status: DC | PRN
  Administered 2013-02-02: .8 mg via INTRAVENOUS

## 2013-02-02 MED ORDER — HYDROMORPHONE HCL 2 MG PO TABS: 2.0000 mg | ORAL_TABLET | ORAL | Status: DC | PRN

## 2013-02-02 MED ORDER — METHOCARBAMOL 500 MG PO TABS: 500.0000 mg | ORAL_TABLET | Freq: Three times a day (TID) | ORAL | Status: DC | PRN

## 2013-02-02 MED ORDER — OXYCODONE HCL 5 MG PO TABS: 5.0000 mg | ORAL_TABLET | Freq: Once | ORAL | Status: DC | PRN

## 2013-02-02 MED ORDER — FENTANYL CITRATE 0.05 MG/ML IJ SOLN
INTRAMUSCULAR | Status: DC | PRN
  Administered 2013-02-02 (×5): 50 ug via INTRAVENOUS

## 2013-02-02 MED ORDER — FENTANYL CITRATE 0.05 MG/ML IJ SOLN
INTRAMUSCULAR | Status: AC
  Administered 2013-02-02: 75 ug via INTRAVENOUS
  Filled 2013-02-02: qty 2

## 2013-02-02 MED ORDER — LIDOCAINE HCL (CARDIAC) 20 MG/ML IV SOLN
INTRAVENOUS | Status: DC | PRN
  Administered 2013-02-02: 50 mg via INTRAVENOUS

## 2013-02-02 MED ORDER — ROCURONIUM BROMIDE 100 MG/10ML IV SOLN
INTRAVENOUS | Status: DC | PRN
  Administered 2013-02-02: 50 mg via INTRAVENOUS

## 2013-02-02 MED ORDER — PROPOFOL 10 MG/ML IV BOLUS
INTRAVENOUS | Status: DC | PRN
  Administered 2013-02-02: 190 mg via INTRAVENOUS

## 2013-02-02 MED ORDER — PHENYLEPHRINE HCL 10 MG/ML IJ SOLN
INTRAMUSCULAR | Status: DC | PRN
  Administered 2013-02-02 (×7): 80 ug via INTRAVENOUS

## 2013-02-02 MED ORDER — OXYCODONE HCL 5 MG/5ML PO SOLN
5.0000 mg | Freq: Once | ORAL | Status: DC | PRN
Start: 2013-02-02 — End: 2013-02-02

## 2013-02-02 MED ORDER — SODIUM CHLORIDE 0.9 % IR SOLN
Status: DC | PRN
  Administered 2013-02-02 (×2): 3000 mL

## 2013-02-02 MED ORDER — MIDAZOLAM HCL 2 MG/2ML IJ SOLN: 0.5000 mg | Freq: Once | INTRAMUSCULAR | Status: DC | PRN

## 2013-02-02 MED ORDER — BUPIVACAINE-EPINEPHRINE PF 0.25-1:200000 % IJ SOLN
INTRAMUSCULAR | Status: AC
  Filled 2013-02-02: qty 30

## 2013-02-02 MED ORDER — PROMETHAZINE HCL 25 MG/ML IJ SOLN
6.2500 mg | INTRAMUSCULAR | Status: DC | PRN
  Administered 2013-02-02: 6.25 mg via INTRAVENOUS

## 2013-02-02 MED ORDER — HYDROMORPHONE HCL PF 1 MG/ML IJ SOLN: 0.2500 mg | INTRAMUSCULAR | Status: DC | PRN

## 2013-02-02 MED ORDER — ONDANSETRON HCL 4 MG/2ML IJ SOLN
INTRAMUSCULAR | Status: DC | PRN
  Administered 2013-02-02: 4 mg via INTRAVENOUS

## 2013-02-02 MED ORDER — BUPIVACAINE-EPINEPHRINE PF 0.5-1:200000 % IJ SOLN
INTRAMUSCULAR | Status: DC | PRN
  Administered 2013-02-02: 25 mL

## 2013-02-02 SURGICAL SUPPLY — 60 items
ANCH SUT 2 1.3X1 LD 1 STRN (Anchor) ×1 IMPLANT
ANCHOR ALL-SUT FLEX 1.3 Y-KNOT (Anchor) ×1 IMPLANT
BIT DRILL 1.3M DISPOSABLE (BIT) ×1 IMPLANT
BLADE AVERAGE 25X9 (BLADE) IMPLANT
BLADE LONG MED 31X9 (MISCELLANEOUS) ×1 IMPLANT
BLADE SURG 11 STRL SS (BLADE) ×2 IMPLANT
BUR OVAL 4.0 (BURR) IMPLANT
CLOTH BEACON ORANGE TIMEOUT ST (SAFETY) ×2 IMPLANT
COVER SURGICAL LIGHT HANDLE (MISCELLANEOUS) ×2 IMPLANT
DRAPE INCISE IOBAN 66X45 STRL (DRAPES) ×2 IMPLANT
DRAPE STERI 35X30 U-POUCH (DRAPES) ×2 IMPLANT
DRAPE U-SHAPE 47X51 STRL (DRAPES) ×2 IMPLANT
DRSG EMULSION OIL 3X3 NADH (GAUZE/BANDAGES/DRESSINGS) ×3 IMPLANT
DRSG PAD ABDOMINAL 8X10 ST (GAUZE/BANDAGES/DRESSINGS) ×3 IMPLANT
DURAPREP 26ML APPLICATOR (WOUND CARE) ×2 IMPLANT
ELECT NDL TIP 2.8 STRL (NEEDLE) IMPLANT
ELECT NEEDLE TIP 2.8 STRL (NEEDLE) ×2 IMPLANT
ELECT REM PT RETURN 9FT ADLT (ELECTROSURGICAL)
ELECTRODE REM PT RTRN 9FT ADLT (ELECTROSURGICAL) IMPLANT
GLOVE BIOGEL PI ORTHO PRO 7.5 (GLOVE) ×1
GLOVE BIOGEL PI ORTHO PRO SZ8 (GLOVE) ×1
GLOVE ORTHO TXT STRL SZ7.5 (GLOVE) ×2 IMPLANT
GLOVE PI ORTHO PRO STRL 7.5 (GLOVE) ×1 IMPLANT
GLOVE PI ORTHO PRO STRL SZ8 (GLOVE) ×1 IMPLANT
GLOVE SURG ORTHO 8.5 STRL (GLOVE) ×2 IMPLANT
GOWN STRL REIN XL XLG (GOWN DISPOSABLE) ×8 IMPLANT
KIT BASIN OR (CUSTOM PROCEDURE TRAY) ×2 IMPLANT
KIT ROOM TURNOVER OR (KITS) ×2 IMPLANT
MANIFOLD NEPTUNE II (INSTRUMENTS) ×2 IMPLANT
NDL HYPO 25GX1X1/2 BEV (NEEDLE) ×1 IMPLANT
NDL SPNL 18GX3.5 QUINCKE PK (NEEDLE) ×1 IMPLANT
NDL SUT .5 MAYO 1.404X.05X (NEEDLE) ×1 IMPLANT
NDL SUT 6 .5 CRC .975X.05 MAYO (NEEDLE) ×1 IMPLANT
NEEDLE HYPO 25GX1X1/2 BEV (NEEDLE) ×2 IMPLANT
NEEDLE MAYO TAPER (NEEDLE) ×4
NEEDLE SPNL 18GX3.5 QUINCKE PK (NEEDLE) ×2 IMPLANT
NS IRRIG 1000ML POUR BTL (IV SOLUTION) ×2 IMPLANT
PACK SHOULDER (CUSTOM PROCEDURE TRAY) ×2 IMPLANT
PAD ARMBOARD 7.5X6 YLW CONV (MISCELLANEOUS) ×4 IMPLANT
RESECTOR FULL RADIUS 4.2MM (BLADE) ×2 IMPLANT
SET ARTHROSCOPY TUBING (MISCELLANEOUS) ×2
SET ARTHROSCOPY TUBING LN (MISCELLANEOUS) ×1 IMPLANT
SLING ARM FOAM STRAP LRG (SOFTGOODS) ×2 IMPLANT
SLING ARM FOAM STRAP MED (SOFTGOODS) IMPLANT
SPONGE GAUZE 4X4 12PLY (GAUZE/BANDAGES/DRESSINGS) ×2 IMPLANT
STRIP CLOSURE SKIN 1/2X4 (GAUZE/BANDAGES/DRESSINGS) ×2 IMPLANT
SUT BONE WAX W31G (SUTURE) ×1 IMPLANT
SUT FIBERWIRE #2 38 T-5 BLUE (SUTURE)
SUT MNCRL AB 3-0 PS2 18 (SUTURE) ×2 IMPLANT
SUT VIC AB 0 CT2 27 (SUTURE) ×2 IMPLANT
SUT VIC AB 2-0 CT1 27 (SUTURE) ×2
SUT VIC AB 2-0 CT1 TAPERPNT 27 (SUTURE) ×1 IMPLANT
SUT VICRYL 0 CT 1 36IN (SUTURE) ×2 IMPLANT
SUTURE FIBERWR #2 38 T-5 BLUE (SUTURE) IMPLANT
SYR CONTROL 10ML LL (SYRINGE) ×2 IMPLANT
TOWEL OR 17X24 6PK STRL BLUE (TOWEL DISPOSABLE) ×2 IMPLANT
TOWEL OR 17X26 10 PK STRL BLUE (TOWEL DISPOSABLE) ×2 IMPLANT
TUBE CONNECTING 12X1/4 (SUCTIONS) ×2 IMPLANT
WAND 90 DEG TURBOVAC W/CORD (SURGICAL WAND) ×2 IMPLANT
WATER STERILE IRR 1000ML POUR (IV SOLUTION) ×2 IMPLANT

## 2013-02-02 NOTE — Interval H&P Note (Signed)
History and Physical Interval Note:  02/02/2013 12:04 PM  Caitlyn Ray  has presented today for surgery, with the diagnosis of RIGHT SHOULDER OA AND SLAP TEAR   The various methods of treatment have been discussed with the patient and family. After consideration of risks, benefits and other options for treatment, the patient has consented to  Procedure(s): RIGHT SHOULDER ARTHROSCOPY WITH SUBACROMIAL DECOMPRESSION AND  OPEN BICEP TENDON REPAIR AND DISTAL CLAVICLE RESECTION (Right) as a surgical intervention .  The patient's history has been reviewed, patient examined, no change in status, stable for surgery.  I have reviewed the patient's chart and labs.  Questions were answered to the patient's satisfaction.     Cordarrius Coad,STEVEN R

## 2013-02-02 NOTE — Transfer of Care (Signed)
Immediate Anesthesia Transfer of Care Note  Patient: Caitlyn Ray  Procedure(s) Performed: Procedure(s): RIGHT SHOULDER ARTHROSCOPY WITH SUBACROMIAL DECOMPRESSION AND  OPEN BICEP TENDON REPAIR AND DISTAL CLAVICLE RESECTION (Right)  Patient Location: PACU  Anesthesia Type:General  Level of Consciousness: awake, alert  and oriented  Airway & Oxygen Therapy: Patient Spontanous Breathing and Patient connected to nasal cannula oxygen  Post-op Assessment: Report given to PACU RN, Post -op Vital signs reviewed and stable and Patient moving all extremities X 4  Post vital signs: Reviewed and stable  Complications: No apparent anesthesia complications

## 2013-02-02 NOTE — Anesthesia Procedure Notes (Addendum)
Anesthesia Regional Block:  Interscalene brachial plexus block  Pre-Anesthetic Checklist: ,, timeout performed, Correct Patient, Correct Site, Correct Laterality, Correct Procedure, Correct Position, site marked, Risks and benefits discussed, at surgeon's request and post-op pain management  Laterality: Upper and Right  Prep: chloraprep       Needles:  Injection technique: Single-shot  Needle Type: Echogenic Stimulator Needle      Needle Gauge: 22 and 22 G  Needle insertion depth: 3 cm   Additional Needles:  Procedures: ultrasound guided (picture in chart) and nerve stimulator Interscalene brachial plexus block  Nerve Stimulator or Paresthesia:  Response: Twitch elicited, 0.5 mA, 0.3 ms,   Additional Responses:   Narrative:  Start time: 02/02/2013 10:55 AM End time: 02/02/2013 11:15 AM Injection made incrementally with aspirations every 5 mL.  Performed by: Personally  Anesthesiologist: Alma Friendly, MD  Additional Notes: Block assessed prior to start of surgery  Interscalene brachial plexus block Procedure Name: Intubation Date/Time: 02/02/2013 12:19 PM Performed by: Sharlene Dory E Pre-anesthesia Checklist: Patient identified, Emergency Drugs available, Suction available, Patient being monitored and Timeout performed Patient Re-evaluated:Patient Re-evaluated prior to inductionOxygen Delivery Method: Circle system utilized Preoxygenation: Pre-oxygenation with 100% oxygen Intubation Type: IV induction Ventilation: Mask ventilation without difficulty Laryngoscope Size: Mac and 3 Grade View: Grade I Tube type: Oral Tube size: 7.0 mm Number of attempts: 1 Airway Equipment and Method: Stylet Placement Confirmation: ETT inserted through vocal cords under direct vision,  positive ETCO2 and breath sounds checked- equal and bilateral Secured at: 21 cm Tube secured with: Tape Dental Injury: Teeth and Oropharynx as per pre-operative assessment

## 2013-02-02 NOTE — Brief Op Note (Signed)
02/02/2013  1:55 PM  PATIENT:  Caitlyn Ray  37 y.o. 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   POST-OPERATIVE DIAGNOSIS:  right shoulder acj oa and slap tear  PROCEDURE:  Procedure(s): RIGHT SHOULDER ARTHROSCOPY WITH SUBACROMIAL DECOMPRESSION AND  OPEN BICEP TENDON REPAIR AND DISTAL CLAVICLE RESECTION (Right) Biceps tenodesis  SURGEON:  Surgeon(s) and Role:    * Verlee Rossetti, MD - Primary  PHYSICIAN ASSISTANT:   ASSISTANTS: Thea Gist, PA-C   ANESTHESIA:   regional and general  EBL:  Total I/O In: 1000 [I.V.:1000] Out: -   BLOOD ADMINISTERED:none  DRAINS: none   LOCAL MEDICATIONS USED:  MARCAINE     SPECIMEN:  No Specimen  DISPOSITION OF SPECIMEN:  N/A  COUNTS:  YES  TOURNIQUET:  * No tourniquets in log *  DICTATION: .Other Dictation: Dictation Number [id number]  PLAN OF CARE: Discharge to home after PACU  PATIENT DISPOSITION:  PACU - hemodynamically stable.   Delay start of Pharmacological VTE agent (>24hrs) due to surgical blood loss or risk of bleeding: no

## 2013-02-02 NOTE — Anesthesia Preprocedure Evaluation (Addendum)
Anesthesia Evaluation  Patient identified by MRN, date of birth, ID band Patient awake    Reviewed: Allergy & Precautions, H&P , NPO status   Airway Mallampati: I  Neck ROM: Full    Dental  (+) Teeth Intact and Dental Advisory Given   Pulmonary shortness of breath, Current Smoker,  breath sounds clear to auscultation        Cardiovascular Rhythm:Regular Rate:Normal     Neuro/Psych CVA    GI/Hepatic   Endo/Other  Morbid obesity  Renal/GU negative Renal ROS     Musculoskeletal   Abdominal (+) + obese,   Peds  Hematology   Anesthesia Other Findings   Reproductive/Obstetrics                         Anesthesia Physical Anesthesia Plan  ASA: III  Anesthesia Plan: General   Post-op Pain Management:    Induction: Intravenous  Airway Management Planned:   Additional Equipment:   Intra-op Plan:   Post-operative Plan:   Informed Consent: I have reviewed the patients History and Physical, chart, labs and discussed the procedure including the risks, benefits and alternatives for the proposed anesthesia with the patient or authorized representative who has indicated his/her understanding and acceptance.   Dental advisory given  Plan Discussed with: CRNA and Surgeon  Anesthesia Plan Comments:         Anesthesia Quick Evaluation

## 2013-02-02 NOTE — Preoperative (Signed)
Beta Blockers   Reason not to administer Beta Blockers:Not Applicable 

## 2013-02-02 NOTE — Anesthesia Postprocedure Evaluation (Signed)
  Anesthesia Post-op Note  Patient: Caitlyn Ray  Procedure(s) Performed: Procedure(s): RIGHT SHOULDER ARTHROSCOPY WITH SUBACROMIAL DECOMPRESSION AND  OPEN BICEP TENDON REPAIR AND DISTAL CLAVICLE RESECTION (Right)  Patient Location: PACU  Anesthesia Type:GA combined with regional for post-op pain  Level of Consciousness: awake, alert , oriented and patient cooperative  Airway and Oxygen Therapy: Patient Spontanous Breathing and Patient connected to nasal cannula oxygen  Post-op Pain: none  Post-op Assessment: Post-op Vital signs reviewed, Patient's Cardiovascular Status Stable, Respiratory Function Stable, Patent Airway, No signs of Nausea or vomiting and Pain level controlled  Post-op Vital Signs: Reviewed and stable  Complications: No apparent anesthesia complications

## 2013-02-03 NOTE — Op Note (Signed)
NAMETHEDORA, RINGS               ACCOUNT NO.:  [id number]  MEDICAL RECORD NO.:  [id number]  LOCATION:  [facility]                         FACILITY:  [facility]  PHYSICIAN:  Almedia Balls. Ranell Patrick, M.D. DATE OF BIRTH:  Oct 22, 1975  DATE OF PROCEDURE:  02/02/2013 DATE OF DISCHARGE:  02/02/2013                              OPERATIVE REPORT   PREOPERATIVE DIAGNOSIS:  Right shoulder superior labrum anterior and posterior lesion and acromioclavicular joint arthritis, symptomatic.  POSTOPERATIVE DIAGNOSIS:  Right shoulder superior labrum anterior and posterior lesion and acromioclavicular joint arthritis, symptomatic.  PROCEDURE PERFORMED:  Right shoulder arthroscopy with extensive intra- articular debridement of torn superior labrum, anterior-posterior, arthroscopic biceps tenotomy, arthroscopic subacromial decompression with open biceps tenodesis in the groove and open distal clavicle resection.  ATTENDING SURGEON:  Almedia Balls. Ranell Patrick, M.D.  ASSISTANT:  Donnie Coffin. Dixon, P.A., who scrubbed the entire procedure and necessary for satisfactory completion of surgery.  ANESTHESIA:  General anesthesia was used plus and interscalene block.  ESTIMATED BLOOD LOSS:  Minimal.  FLUID REPLACEMENT:  1500 mL of crystalloids.  INSTRUMENT COUNTS:  Correct.  COMPLICATIONS:  There were no complications.  ANTIBIOTICS:  Perioperative antibiotics were given.  INDICATIONS:  The patient is a 37 year old female with worsening right shoulder pain secondary to MRI documented SLAP lesion and AC joint arthritis, which is symptomatic.  The patient has failed conservative management, presents for operative treatment to restore function, eliminate pain, and informed consent obtained.  DESCRIPTION OF PROCEDURE:  After an adequate level of anesthesia was achieved, the patient was positioned in the modified beach-chair position.  Right shoulder correctly identified, sterilely prepped and draped in usual manner.  We did  examine her under anesthesia with slight laxity, but good end points with abduction, external rotation, 1+ anterior glide with again a good end point.  After a sterile prep and drape and time-out, we entered the shoulder using standard portals including anterior, posterior, and lateral portals.  We identified significant tearing in the superior labral biceps junction that was a Buford complex for sublabral hole, which may have contributed to some of the tearing of the superior labrum.  We performed a biceps tenotomy and labrum debrided back to stable labral rim.  Anterior inferior and posterior inferior labrum intact.  Articular cartilage is normal. Positive drive-through sign.  Subscapularis normal.  Rotator cuff was completely normal as visualized from the undersurface.  Placed the scope in subacromial space.  Thorough bursectomy and acromioplasty was performed creating a type 1 acromial shape with significant attention towards debriding out laterally where there was some lateral overhang. Rotator cuff was inspected, bursectomy performed.  The cuff was looking and palpated as normal.  We then concluded the arthroscopic portion of the procedure.  We then made a small saber incision overlying the AC joint.  Dissection down through subcutaneous tissues.  The deltotrapezial fascia identified and divided in line with distal clavicle.  Subperiosteal dissection of the distal clavicle performed followed by excision of distal 2-3 mm using oscillating saw.  We thoroughly irrigated the AC interval, applied bone wax cutting the clavicle and then repaired deltotrapezial fascia with 0-Vicryl suture followed by 2-0 Vicryl for subcutaneous closure and 4-0 Monocryl for skin.  We then addressed right biceps tenodesis through a small incision overlying the anterior shoulder.  This was longitudinal in line with the deltoid fibers we dissected down through subcutaneous tissues using Bovie, identified the  deltoid and split the muscle fibers bluntly.  We went in the raphe between the anterior and lateral heads.  We placed Arthrex retractor, identified the biceps groove, delivered the tendon out of the wound, used a Therapist, nutritional to prep the floor of the biceps groove to prepare for bleeding.  We went ahead and placed a single Y- Knot by Linvatec to the floor of the groove, brought that suture up in a whipstitch fashion through the biceps tendon tenodesing the tendon with the elbow at 90 degrees.  We then over sewed the soft tissue with 0- Vicryl suture and nice low-profile repair, palpated the rotator cuff, which was nice and thick and no signs of any tearing or thinning or palpable defects.  We thoroughly irrigated the subdeltoid plane, repaired deltoid anatomically with 0-Vicryl suture followed by 2-0 Vicryl for subcutaneous closure and 4-0 running Monocryl for skin and portals.  Steri-Strips applied followed by sterile dressing.  The patient tolerated the surgery well.     Almedia Balls. Ranell Patrick, M.D.     SRN/MEDQ  D:  02/02/2013  T:  02/03/2013  Job:  [id number]

## 2013-02-06 ENCOUNTER — Encounter (HOSPITAL_COMMUNITY): Payer: Self-pay | Admitting: Orthopedic Surgery

## 2013-11-17 ENCOUNTER — Emergency Department (HOSPITAL_COMMUNITY): Payer: No Typology Code available for payment source

## 2013-11-17 ENCOUNTER — Emergency Department (HOSPITAL_COMMUNITY)
Admission: EM | Admit: 2013-11-17 | Discharge: 2013-11-17 | Disposition: A | Discharge status: Home / Self Care | Payer: No Typology Code available for payment source | Attending: Emergency Medicine | Admitting: Emergency Medicine

## 2013-11-17 ENCOUNTER — Encounter (HOSPITAL_COMMUNITY): Payer: Self-pay | Admitting: Emergency Medicine

## 2013-11-17 DIAGNOSIS — M79601 Pain in right arm: Secondary | ICD-10-CM

## 2013-11-17 DIAGNOSIS — Z8639 Personal history of other endocrine, nutritional and metabolic disease: Secondary | ICD-10-CM | POA: Diagnosis not present

## 2013-11-17 DIAGNOSIS — Z792 Long term (current) use of antibiotics: Secondary | ICD-10-CM | POA: Diagnosis not present

## 2013-11-17 DIAGNOSIS — M25519 Pain in unspecified shoulder: Secondary | ICD-10-CM | POA: Diagnosis not present

## 2013-11-17 DIAGNOSIS — F411 Generalized anxiety disorder: Secondary | ICD-10-CM | POA: Diagnosis not present

## 2013-11-17 DIAGNOSIS — Z862 Personal history of diseases of the blood and blood-forming organs and certain disorders involving the immune mechanism: Secondary | ICD-10-CM | POA: Insufficient documentation

## 2013-11-17 DIAGNOSIS — F17200 Nicotine dependence, unspecified, uncomplicated: Secondary | ICD-10-CM | POA: Insufficient documentation

## 2013-11-17 DIAGNOSIS — M25539 Pain in unspecified wrist: Secondary | ICD-10-CM | POA: Diagnosis not present

## 2013-11-17 DIAGNOSIS — Z9889 Other specified postprocedural states: Secondary | ICD-10-CM | POA: Diagnosis not present

## 2013-11-17 DIAGNOSIS — Z880 Allergy status to penicillin: Secondary | ICD-10-CM | POA: Diagnosis not present

## 2013-11-17 DIAGNOSIS — Z79899 Other long term (current) drug therapy: Secondary | ICD-10-CM | POA: Insufficient documentation

## 2013-11-17 DIAGNOSIS — M79609 Pain in unspecified limb: Secondary | ICD-10-CM | POA: Diagnosis present

## 2013-11-17 HISTORY — DX: Dorsalgia, unspecified: M54.9

## 2013-11-17 HISTORY — DX: Vitamin D deficiency, unspecified: E55.9

## 2013-11-17 MED ORDER — HYDROCODONE-ACETAMINOPHEN 5-325 MG PO TABS
1.0000 | ORAL_TABLET | Freq: Once | ORAL | Status: AC
  Administered 2013-11-17: 1 via ORAL
  Filled 2013-11-17: qty 1

## 2013-11-17 MED ORDER — HYDROCODONE-ACETAMINOPHEN 5-325 MG PO TABS: 1.0000 | ORAL_TABLET | Freq: Four times a day (QID) | ORAL | Status: DC | PRN

## 2013-11-17 MED ORDER — IBUPROFEN 600 MG PO TABS: 600.0000 mg | ORAL_TABLET | Freq: Three times a day (TID) | ORAL | Status: AC

## 2013-11-17 NOTE — ED Provider Notes (Signed)
CSN: [id number]     Arrival date & time 11/17/13  1658 History   First MD Initiated Contact with Patient 11/17/13 1721     Chief Complaint  Patient presents with  . Arm Pain      HPI  Patient presents with acute onset of pain in the right shoulder and,. Patient was lifting another patient at work, when she felt the acute onset of sharp pain in these areas. Since onset has been persistent, worse with motion. No improvement with OTC medication. No other complaints, noncontributory Patient has a history of prior surgical repair of biceps tendon rupture. 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  Past Medical History  Diagnosis Date  . Chest pain, unspecified 2010  . Tobacco use disorder   . Anxiety   . Knee pain   . Claustrophobia   . Complication of anesthesia     shortness of breath after ovarian surgery d/t face mask; needs nasal cannula d/t claustophobia  . Vitamin D deficiency   . Back pain    Past Surgical History  Procedure Laterality Date  . Ovarian cyst removal    . Cholecystectomy    . Tubal ligation    . Abdominal hysterectomy      partial  . Cardiac catheterization  01/31/2009    NL coronaries, NL LVEF 55-60%, no MR (Dr. Sherren Mocha)  . Shoulder arthroscopy with subacromial decompression and open rotator c Right 02/02/2013    Procedure: RIGHT SHOULDER ARTHROSCOPY WITH SUBACROMIAL DECOMPRESSION AND  OPEN BICEP TENDON REPAIR AND DISTAL CLAVICLE RESECTION;  Surgeon: Augustin Schooling, MD;  Location: [place];  Service: Orthopedics;  Laterality: Right;   Family History  Problem Relation Age of Onset  . Heart disease Father     premature   History  Substance Use Topics  . Smoking status: Current Every Day Smoker -- 0.50 packs/day for 20 years    Types: Cigarettes  . Smokeless tobacco: Never Used  . Alcohol Use: No   OB History   Grav Para Term Preterm Abortions TAB SAB Ect Mult Living   3 3 3       3      Review of Systems  All other  systems reviewed and are negative.     Allergies  Aspirin; Penicillins; Tramadol; and Vancomycin  Home Medications   Prior to Admission medications   Medication Sig Start Date End Date Taking? Authorizing Provider  Cholecalciferol (VITAMIN D) 2000 UNITS CAPS Take 1 capsule by mouth daily.   Yes Historical Provider, MD  escitalopram (LEXAPRO) 20 MG tablet Take 20 mg by mouth daily. 10/18/13  Yes Historical Provider, MD  ibuprofen (ADVIL,MOTRIN) 200 MG tablet Take 600 mg by mouth every 6 (six) hours as needed for headache, mild pain or moderate pain.    Yes Historical Provider, MD  azithromycin (ZITHROMAX) 250 MG tablet Take 250-500 mg by mouth See admin instructions. Take two tablets on day 1 then take one tablet on days 2 through 5 11/06/13   Historical Provider, MD   BP 160/110  Pulse 84  Temp(Src) 98.2 F (36.8 C) (Oral)  Resp 16  Ht 5\' 10"  (1.778 m)  Wt 243 lb (110.224 kg)  BMI 34.87 kg/m2  SpO2 98% Physical Exam  Nursing note and vitals reviewed. Constitutional: She is oriented to person, place, and time. She appears well-developed and well-nourished. No distress.  HENT:  Head: Normocephalic and atraumatic.  Eyes: Conjunctivae and EOM are normal.  Cardiovascular: Normal rate and regular  rhythm.   Pulmonary/Chest: Effort normal and breath sounds normal. No stridor. No respiratory distress.  Abdominal: She exhibits no distension.  Musculoskeletal: She exhibits no edema.       Right wrist: She exhibits decreased range of motion, tenderness and bony tenderness. She exhibits no swelling, no effusion, no crepitus, no deformity and no laceration.  Initially patient is unwilling to move the shoulder in any dimensions, but she slowly flexes and extends the elbow. With external rotation patient has a shoulder reduction, and subsequently is moving her shoulder freely. There is no gross deformity, but the patient continues to have hesitancy with biceps flexion greater than triceps  extension. No snuffbox tenderness   Neurological: She is alert and oriented to person, place, and time. No cranial nerve deficit.  Skin: Skin is warm and dry.  Psychiatric: She has a normal mood and affect.    ED Course  Procedures (including critical care time)  Imaging Review Dg Shoulder Right  11/17/2013   CLINICAL DATA:  Right shoulder pain after injury.  EXAM: RIGHT SHOULDER - 2+ VIEW  COMPARISON:  None.  FINDINGS: There is no evidence of fracture or dislocation. Visualized ribs appear normal. There is no evidence of arthropathy or other focal bone abnormality. Soft tissues are unremarkable.  IMPRESSION: Normal right shoulder.   Electronically Signed   By: Sabino Dick M.D.   On: 11/17/2013 18:12   Dg Hand Complete Right  11/17/2013   CLINICAL DATA:  Right hand pain  EXAM: RIGHT HAND - COMPLETE 3+ VIEW  COMPARISON:  None.  FINDINGS: Three views of right hand submitted. No acute fracture or subluxation. No radiopaque foreign body.  IMPRESSION: Negative.   Electronically Signed   By: Lahoma Crocker M.D.   On: 11/17/2013 18:11   With referring x-rays, patient was placed in a splint, will followup with orthopedics   MDM   Final diagnoses:  Pain of right upper extremity    Patient presents with right upper extremity pain.  Patient is distally neurovascularly intact, though she is hesitant to move the elbow, shoulder and wrist secondary to pain.  Patient has preserved biceps function, reassuring for low suspicion of recurrent tendon rupture. Initially patient subluxation of the shoulder, which was reduced with manual reduction.  Subsequently patient had decreased pain and increased range of motion.  On with referring x-rays, no evidence for fracture, patient was discharged in stable condition to follow up with orthopedics.    Carmin Muskrat, MD 11/17/13 [phone]

## 2013-11-17 NOTE — ED Notes (Signed)
Patient reports right arm pain. Per patient had surgery in September of last year for torn bicep. Patient reports moving patient up in bed when arm started to hurt again. Per patient can not raise arm above head. Patient also reports hurting right finger.

## 2013-11-17 NOTE — Discharge Instructions (Signed)
As discussed, your evaluation today has been largely reassuring.  But, it is important that you monitor your condition carefully, and do not hesitate to return to the ED if you develop new, or concerning changes in your condition. ? ?Otherwise, please follow-up with your physician for appropriate ongoing care. ? ?

## 2013-11-17 NOTE — ED Notes (Signed)
Patient with no complaints at this time. Respirations even and unlabored. Skin warm/dry. Discharge instructions reviewed with patient at this time. Patient given opportunity to voice concerns/ask questions. Patient discharged at this time and left Emergency Department with steady gait.   

## 2014-03-25 ENCOUNTER — Encounter (HOSPITAL_COMMUNITY): Payer: Self-pay | Admitting: Emergency Medicine

## 2014-09-13 IMAGING — CR DG HAND COMPLETE 3+V*R*
3 series · 3 of 3 positions shown · non-contrast
Comparison: None.

CLINICAL DATA: Right hand pain

EXAM:
RIGHT HAND - COMPLETE 3+ VIEW

[view not recorded (1 of 3)]
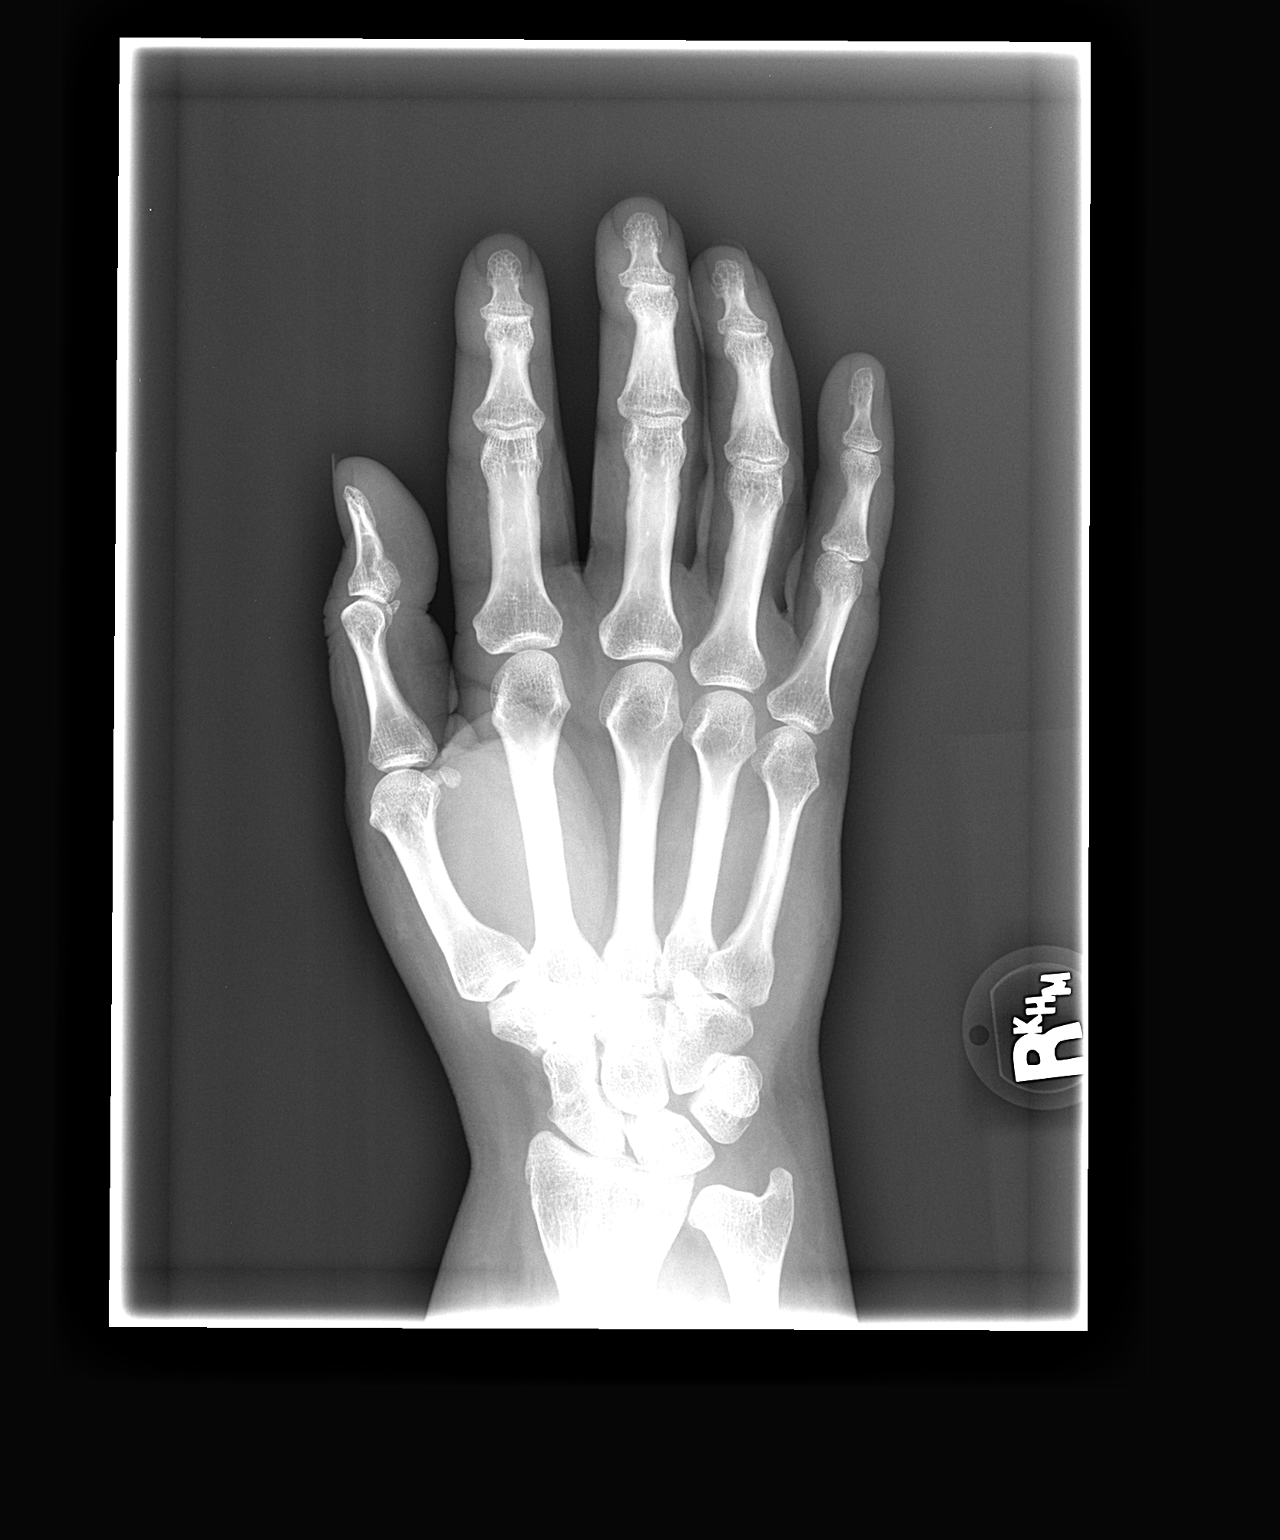

[view not recorded (2 of 3)]
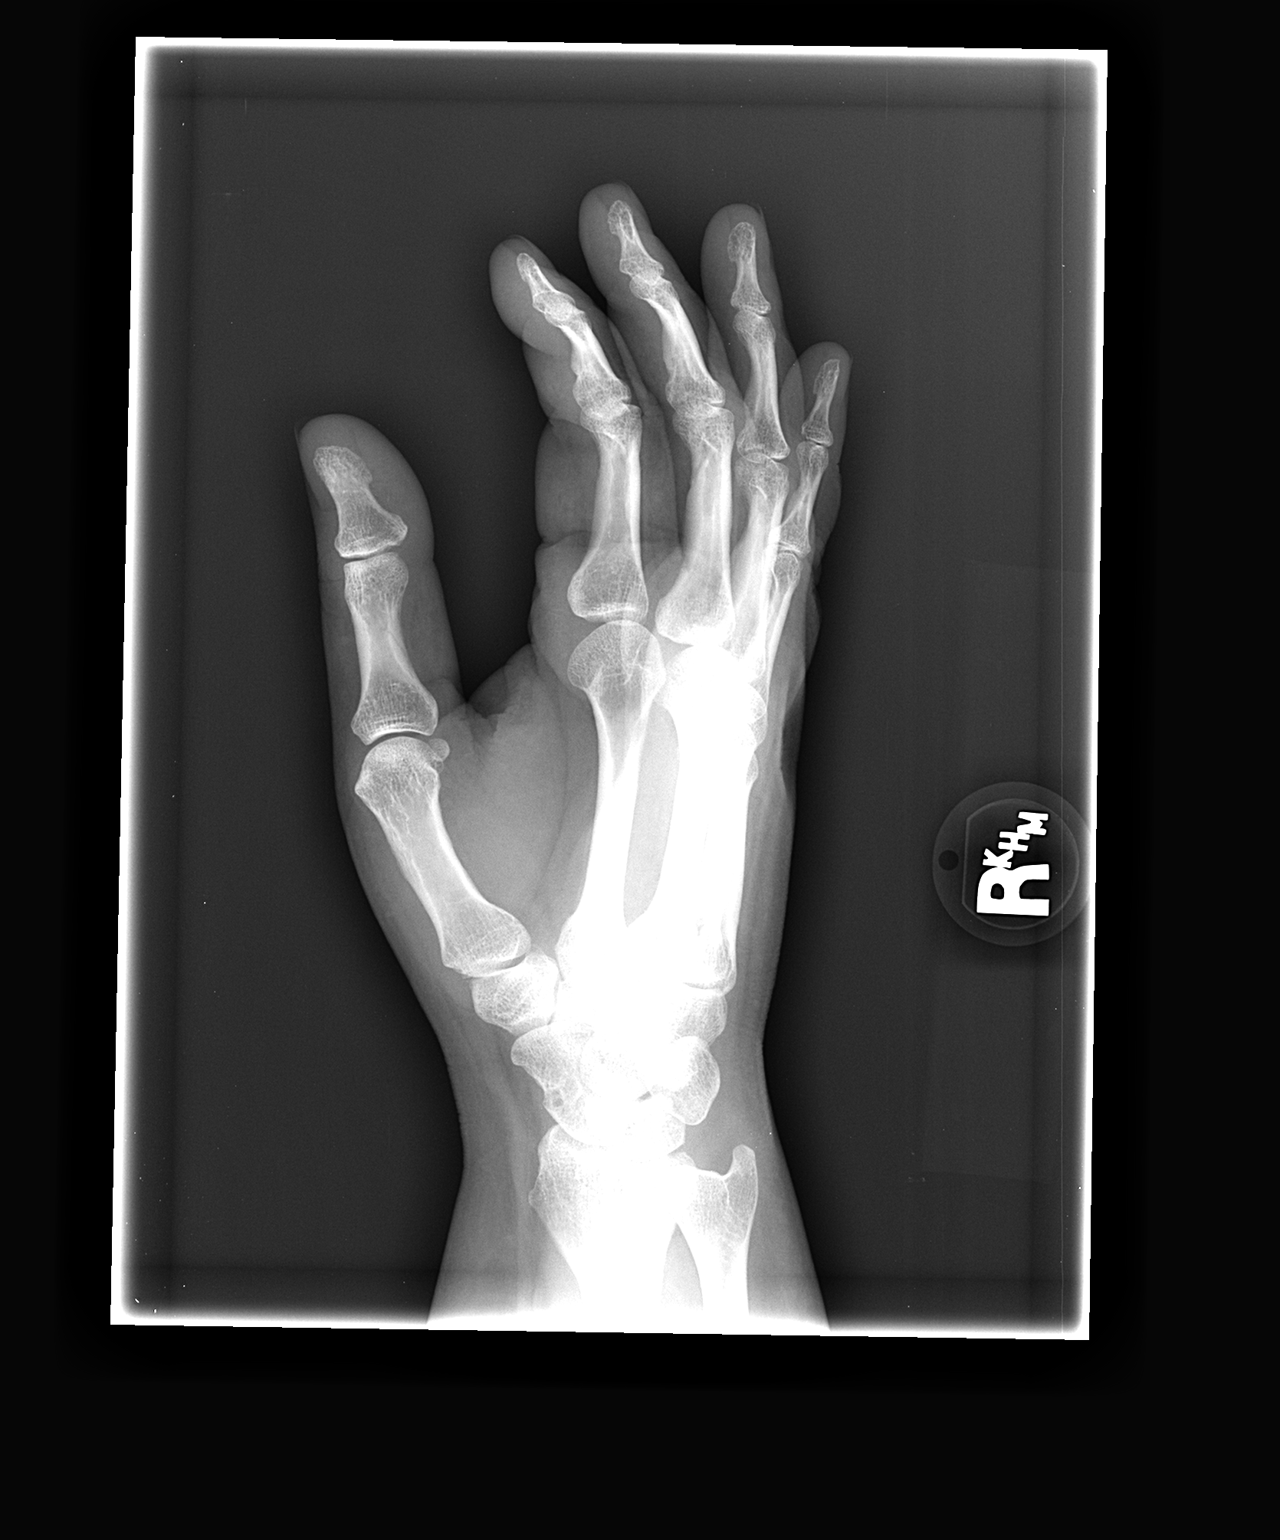

[view not recorded (3 of 3)]
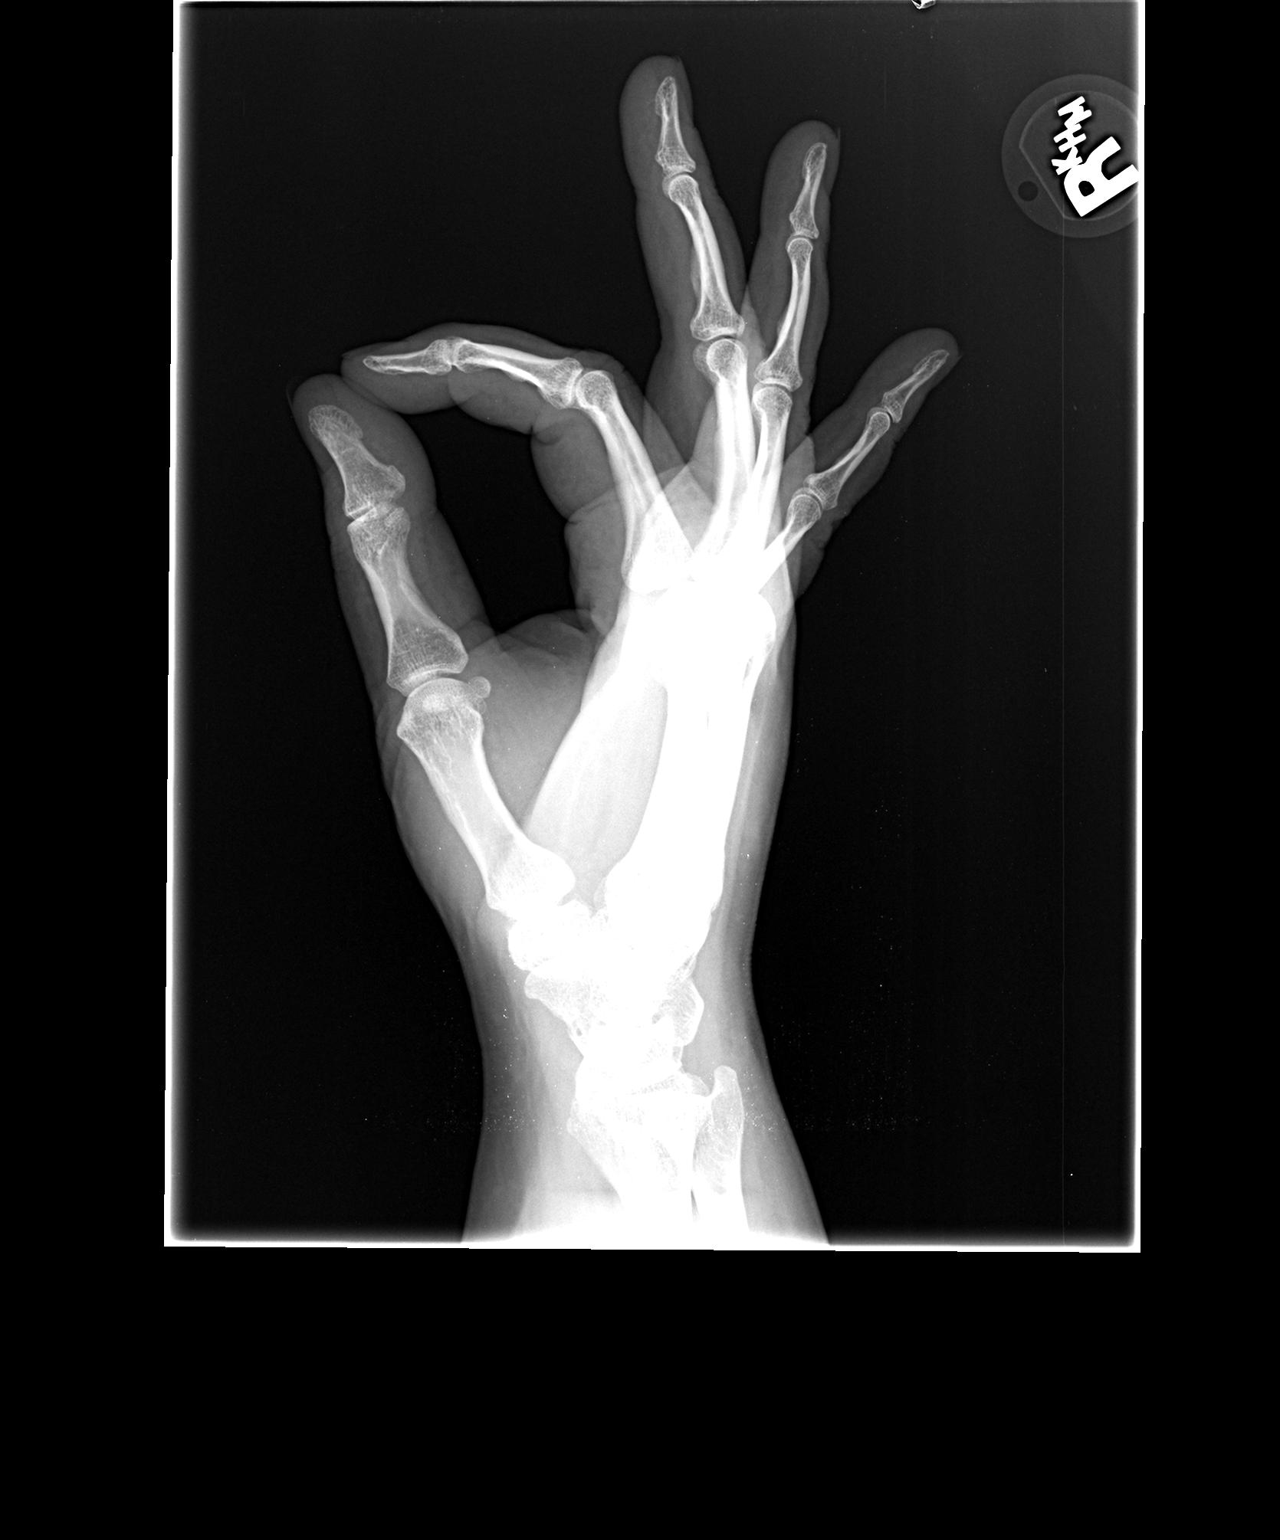

[3 of 3 positions shown; findings below may reference images not displayed]

FINDINGS: Three views of right hand submitted. No acute fracture or
subluxation. No radiopaque foreign body.
IMPRESSION: Negative.

## 2014-09-13 IMAGING — CR DG SHOULDER 2+V*R*
3 series · 3 of 3 positions shown · non-contrast
Comparison: None.

CLINICAL DATA: Right shoulder pain after injury.

EXAM:
RIGHT SHOULDER - 2+ VIEW

[view not recorded (1 of 3)]
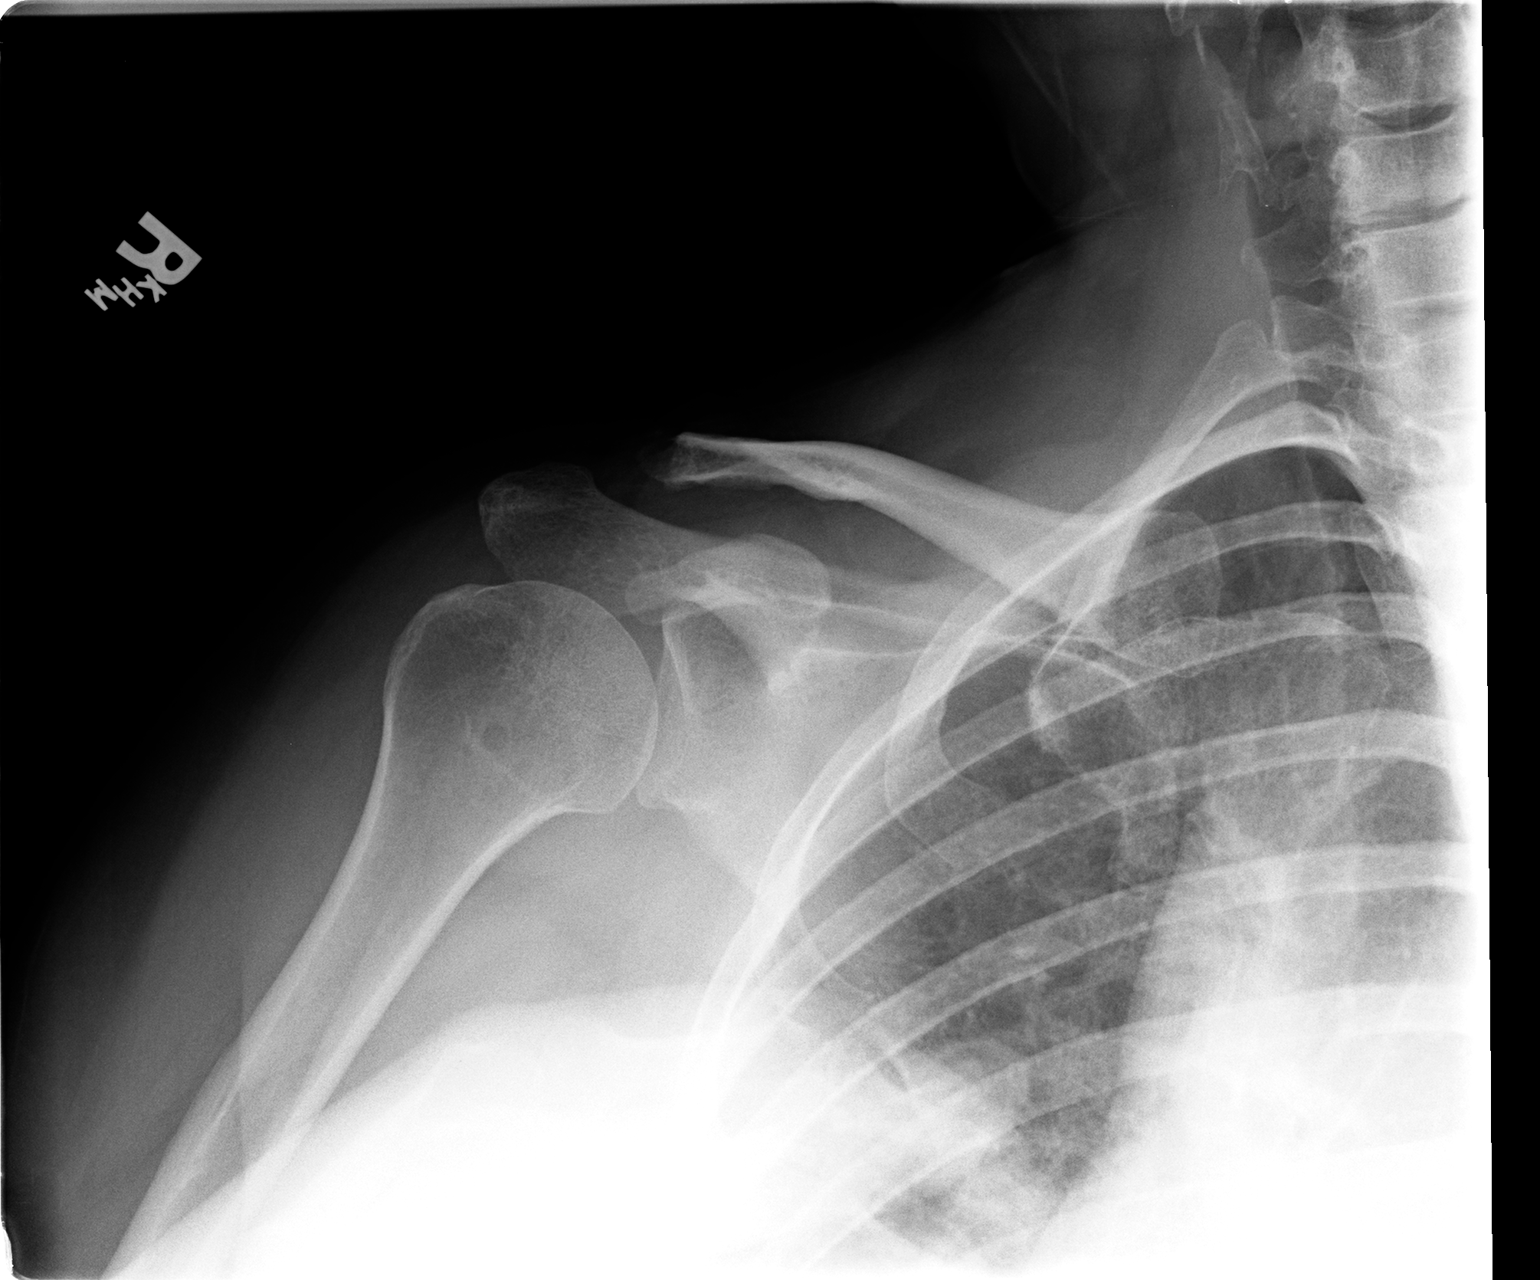

[view not recorded (2 of 3)]
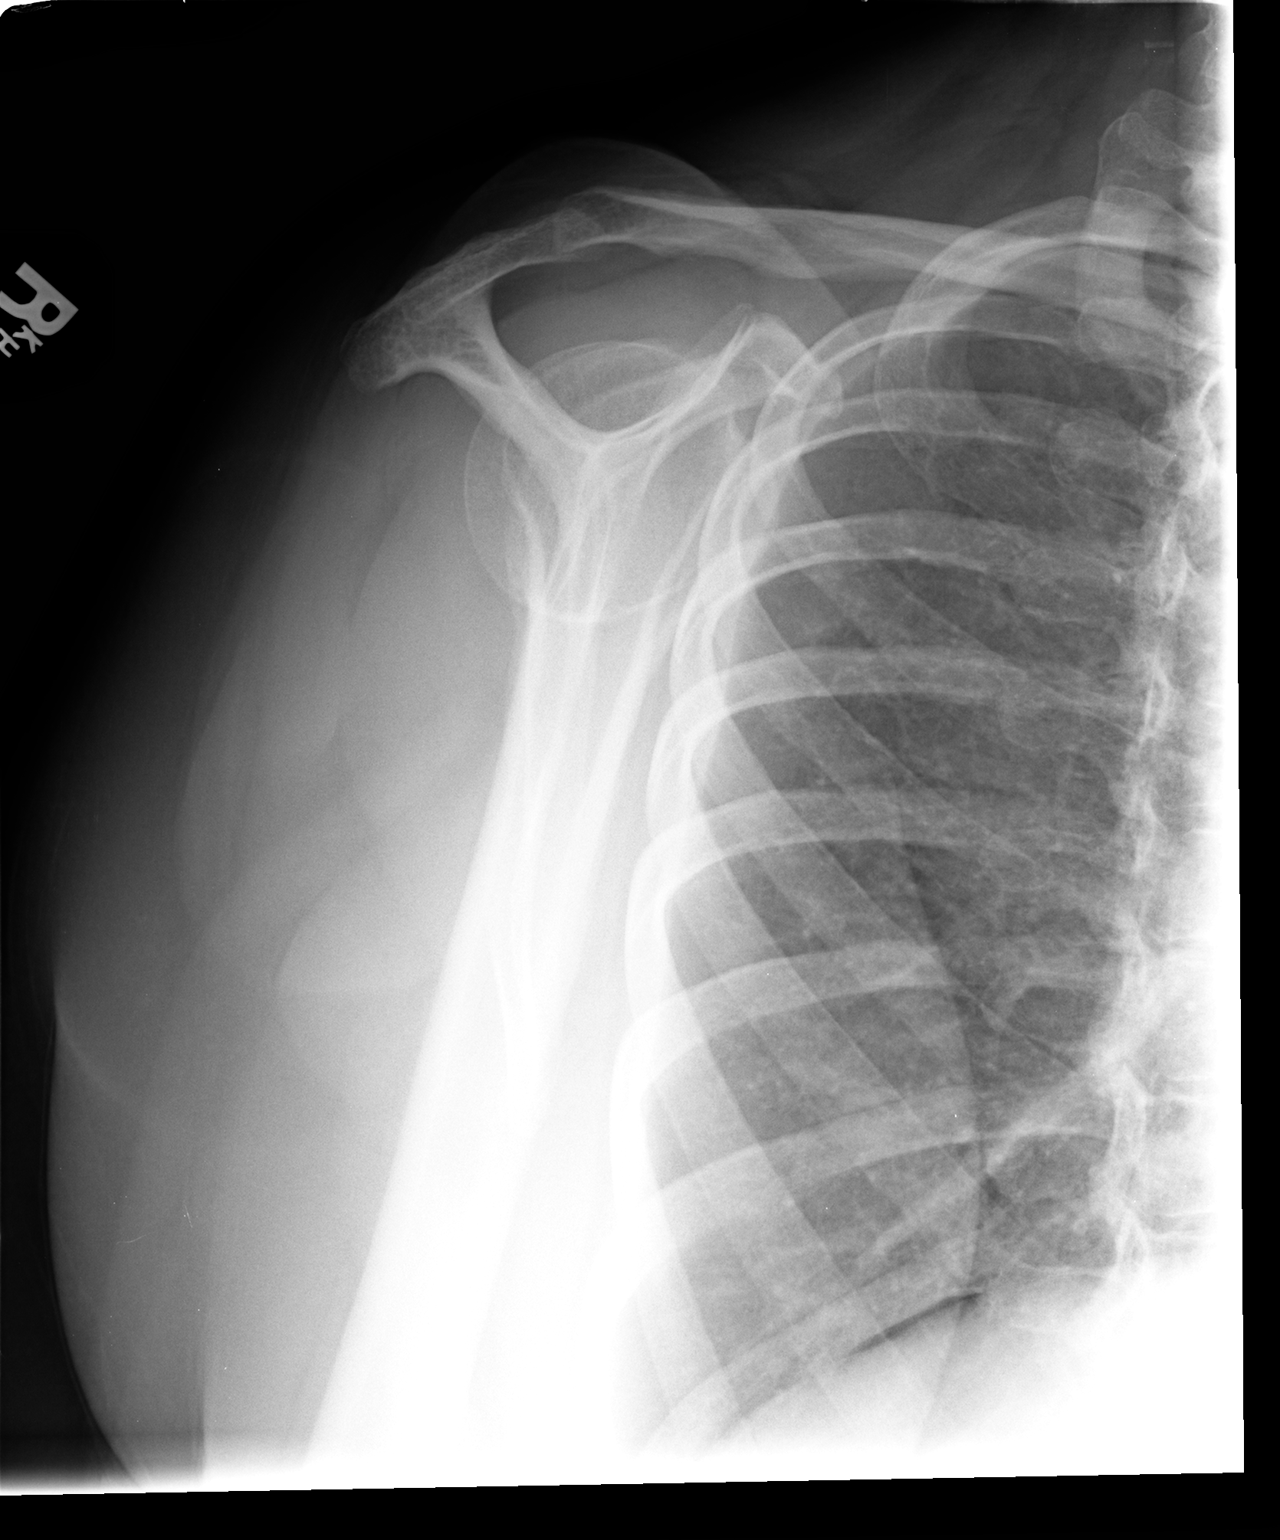

[view not recorded (3 of 3)]
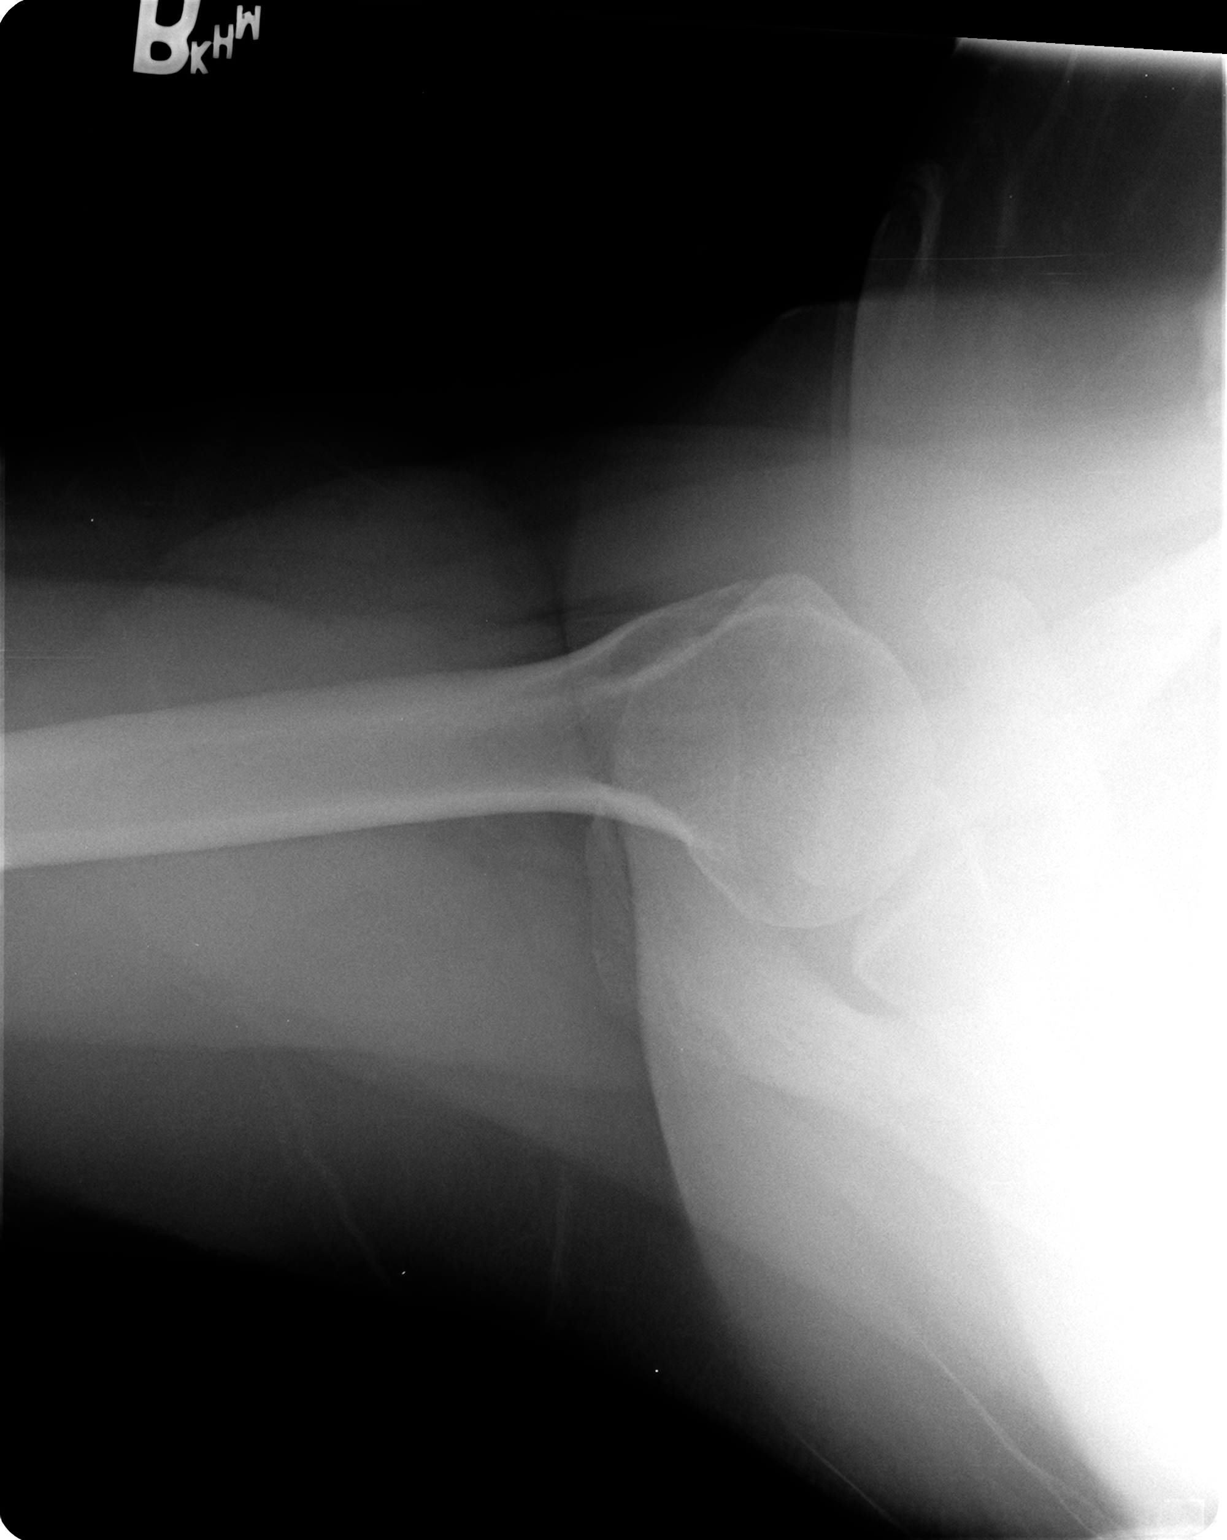

[3 of 3 positions shown; findings below may reference images not displayed]

FINDINGS: There is no evidence of fracture or dislocation. Visualized ribs
appear normal. There is no evidence of arthropathy or other focal
bone abnormality. Soft tissues are unremarkable.
IMPRESSION: Normal right shoulder.

## 2015-07-29 ENCOUNTER — Ambulatory Visit (INDEPENDENT_AMBULATORY_CARE_PROVIDER_SITE_OTHER): Payer: Managed Care, Other (non HMO) | Admitting: Urology

## 2015-07-29 ENCOUNTER — Other Ambulatory Visit: Payer: Self-pay | Admitting: Urology

## 2015-07-29 DIAGNOSIS — R319 Hematuria, unspecified: Secondary | ICD-10-CM

## 2015-07-29 DIAGNOSIS — R1032 Left lower quadrant pain: Secondary | ICD-10-CM

## 2015-07-29 DIAGNOSIS — R3129 Other microscopic hematuria: Secondary | ICD-10-CM

## 2015-08-01 ENCOUNTER — Ambulatory Visit (HOSPITAL_COMMUNITY)
Admission: RE | Admit: 2015-08-01 | Discharge: 2015-08-01 | Disposition: A | Discharge status: Home / Self Care | Payer: Managed Care, Other (non HMO) | Source: Ambulatory Visit | Attending: Urology | Admitting: Urology

## 2015-08-01 DIAGNOSIS — R319 Hematuria, unspecified: Secondary | ICD-10-CM | POA: Diagnosis present

## 2015-08-01 DIAGNOSIS — M5136 Other intervertebral disc degeneration, lumbar region: Secondary | ICD-10-CM | POA: Diagnosis not present

## 2015-08-01 DIAGNOSIS — K5730 Diverticulosis of large intestine without perforation or abscess without bleeding: Secondary | ICD-10-CM | POA: Diagnosis not present

## 2015-08-01 DIAGNOSIS — I700 Atherosclerosis of aorta: Secondary | ICD-10-CM | POA: Insufficient documentation

## 2015-08-01 DIAGNOSIS — R1032 Left lower quadrant pain: Secondary | ICD-10-CM | POA: Diagnosis not present

## 2015-08-01 MED ORDER — IOHEXOL 300 MG/ML  SOLN
150.0000 mL | Freq: Once | INTRAMUSCULAR | Status: AC | PRN
  Administered 2015-08-01: 150 mL via INTRAVENOUS

## 2015-08-01 MED ORDER — SODIUM CHLORIDE 0.9 % IV SOLN
INTRAVENOUS | Status: DC
Start: 2015-08-01 — End: 2015-08-02
  Filled 2015-08-01: qty 250

## 2015-09-23 ENCOUNTER — Ambulatory Visit (INDEPENDENT_AMBULATORY_CARE_PROVIDER_SITE_OTHER): Payer: Managed Care, Other (non HMO) | Admitting: Urology

## 2015-09-23 DIAGNOSIS — R3129 Other microscopic hematuria: Secondary | ICD-10-CM

## 2015-10-01 ENCOUNTER — Other Ambulatory Visit (HOSPITAL_COMMUNITY): Payer: Self-pay | Admitting: Internal Medicine

## 2015-10-01 DIAGNOSIS — M545 Low back pain: Secondary | ICD-10-CM

## 2015-10-08 ENCOUNTER — Ambulatory Visit (HOSPITAL_COMMUNITY)
Admission: RE | Admit: 2015-10-08 | Discharge: 2015-10-08 | Disposition: A | Discharge status: Home / Self Care | Payer: Managed Care, Other (non HMO) | Source: Ambulatory Visit | Attending: Internal Medicine | Admitting: Internal Medicine

## 2015-10-08 ENCOUNTER — Other Ambulatory Visit (HOSPITAL_COMMUNITY): Payer: Self-pay | Admitting: Internal Medicine

## 2015-10-08 DIAGNOSIS — M545 Low back pain: Secondary | ICD-10-CM

## 2015-11-12 ENCOUNTER — Other Ambulatory Visit (HOSPITAL_COMMUNITY): Payer: Self-pay | Admitting: Internal Medicine

## 2015-11-12 DIAGNOSIS — Z1231 Encounter for screening mammogram for malignant neoplasm of breast: Secondary | ICD-10-CM

## 2015-11-17 ENCOUNTER — Ambulatory Visit (HOSPITAL_COMMUNITY)
Admission: RE | Admit: 2015-11-17 | Discharge: 2015-11-17 | Disposition: A | Discharge status: Home / Self Care | Payer: Managed Care, Other (non HMO) | Source: Ambulatory Visit | Attending: Internal Medicine | Admitting: Internal Medicine

## 2015-11-17 DIAGNOSIS — Z1231 Encounter for screening mammogram for malignant neoplasm of breast: Secondary | ICD-10-CM | POA: Insufficient documentation

## 2015-12-12 ENCOUNTER — Ambulatory Visit (HOSPITAL_COMMUNITY): Payer: Managed Care, Other (non HMO)

## 2015-12-16 ENCOUNTER — Ambulatory Visit (HOSPITAL_COMMUNITY)
Admission: RE | Admit: 2015-12-16 | Discharge: 2015-12-16 | Disposition: A | Discharge status: Home / Self Care | Payer: Managed Care, Other (non HMO) | Source: Ambulatory Visit | Attending: Internal Medicine | Admitting: Internal Medicine

## 2015-12-16 DIAGNOSIS — M545 Low back pain: Secondary | ICD-10-CM | POA: Insufficient documentation

## 2015-12-16 DIAGNOSIS — M5137 Other intervertebral disc degeneration, lumbosacral region: Secondary | ICD-10-CM | POA: Insufficient documentation

## 2016-01-23 ENCOUNTER — Other Ambulatory Visit (HOSPITAL_COMMUNITY): Payer: Self-pay | Admitting: Internal Medicine

## 2016-01-23 DIAGNOSIS — Z780 Asymptomatic menopausal state: Secondary | ICD-10-CM

## 2016-02-04 ENCOUNTER — Inpatient Hospital Stay (HOSPITAL_COMMUNITY): Admission: RE | Admit: 2016-02-04 | Payer: Managed Care, Other (non HMO) | Source: Ambulatory Visit

## 2016-02-16 ENCOUNTER — Telehealth: Payer: Self-pay

## 2016-02-16 ENCOUNTER — Ambulatory Visit: Payer: Managed Care, Other (non HMO) | Admitting: Neurology

## 2016-02-16 NOTE — Telephone Encounter (Signed)
Pt did not show for their appt with Dr. Dohmeier today.  

## 2016-02-17 ENCOUNTER — Encounter: Payer: Self-pay | Admitting: Neurology

## 2016-07-23 ENCOUNTER — Encounter: Payer: Self-pay | Admitting: Family Medicine

## 2016-07-23 ENCOUNTER — Ambulatory Visit (INDEPENDENT_AMBULATORY_CARE_PROVIDER_SITE_OTHER): Payer: Managed Care, Other (non HMO) | Admitting: Family Medicine

## 2016-07-23 ENCOUNTER — Ambulatory Visit: Payer: Self-pay | Admitting: Family Medicine

## 2016-07-23 VITALS — BP 130/88 | HR 86 | Temp 98.7°F | Resp 18 | Ht 69.0 in | Wt 234.0 lb

## 2016-07-23 DIAGNOSIS — F418 Other specified anxiety disorders: Secondary | ICD-10-CM | POA: Diagnosis not present

## 2016-07-23 DIAGNOSIS — Z7689 Persons encountering health services in other specified circumstances: Secondary | ICD-10-CM | POA: Diagnosis not present

## 2016-07-23 DIAGNOSIS — M47816 Spondylosis without myelopathy or radiculopathy, lumbar region: Secondary | ICD-10-CM | POA: Diagnosis not present

## 2016-07-23 DIAGNOSIS — M25551 Pain in right hip: Secondary | ICD-10-CM | POA: Diagnosis not present

## 2016-07-23 DIAGNOSIS — E785 Hyperlipidemia, unspecified: Secondary | ICD-10-CM

## 2016-07-23 DIAGNOSIS — Z23 Encounter for immunization: Secondary | ICD-10-CM

## 2016-07-23 DIAGNOSIS — M5416 Radiculopathy, lumbar region: Secondary | ICD-10-CM | POA: Diagnosis not present

## 2016-07-23 DIAGNOSIS — G8929 Other chronic pain: Secondary | ICD-10-CM

## 2016-07-23 HISTORY — DX: Radiculopathy, lumbar region: M54.16

## 2016-07-23 MED ORDER — METHYLPREDNISOLONE 4 MG PO TBPK: ORAL_TABLET | ORAL | 0 refills | Status: AC

## 2016-07-23 MED ORDER — CYCLOBENZAPRINE HCL 5 MG PO TABS: 5.0000 mg | ORAL_TABLET | Freq: Three times a day (TID) | ORAL | 1 refills | Status: AC | PRN

## 2016-07-23 MED ORDER — DULOXETINE HCL 30 MG PO CPEP: 30.0000 mg | ORAL_CAPSULE | Freq: Every day | ORAL | 3 refills | Status: AC

## 2016-07-23 NOTE — Progress Notes (Signed)
Chief Complaint  Patient presents with  . Establish Care   NEW TO ESTABLISH Old records requested I have discussed the multiple health risks associated with cigarette smoking including, but not limited to, cardiovascular disease, lung disease and cancer.  I have strongly recommended that smoking be stopped.  I have reviewed the various methods of quitting including cold [place], classes, nicotine replacements and prescription medications.  I have offered assistance in this difficult process.  The patient is not interested in assistance at this time.  Feels unable to stop.  Feels especially stressed. Agrees to change to lite product. Biggest ongoing issue is chronic low back pain.  Affects her on a daily basis.  Cries every day.  Has been on a number of SSRI - none have worked so far.  She saw Dr Carloyn Manner in [place].  He recommended PT in December.  It has not yet been ordered.  She wants another opinion. She is allergic to NSAID drugs. Intolerant of tylenol Has not been on muscle relaxer Has not been on prednisone Has not been on lyrica.  Took gabapentin 800 TID with no success. Is on a long acting oxycodone.  I told her I do not agree in general with long term narcotics for non cancer pain and will not offer her pain management with opiates.  MRI reviewed with patient.  She has very localized disease at the L5S1 area on the R.   Patient Active Problem List   Diagnosis Date Noted  . Depression with anxiety 07/23/2016  . 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 Right lumbar radiculopathy 07/23/2016  . HLD (hyperlipidemia) 07/23/2016  . TOBACCO ABUSE 02/05/2009    Outpatient Encounter Prescriptions as of 07/23/2016  Medication Sig  . atorvastatin (LIPITOR) 40 MG tablet Take 40 mg by mouth at bedtime.  . Cholecalciferol (VITAMIN D) 2000 UNITS CAPS Take 1 capsule by mouth daily.  Marland Kitchen XTAMPZA ER 13.5 MG C12A TAKE ONE CAPSULE BY MOUTH 4 TIMES A DAY  . cyclobenzaprine (FLEXERIL)  5 MG tablet Take 1 tablet (5 mg total) by mouth 3 (three) times daily as needed for muscle spasms.  . DULoxetine (CYMBALTA) 30 MG capsule Take 1 capsule (30 mg total) by mouth daily.  . methylPREDNISolone (MEDROL DOSEPAK) 4 MG TBPK tablet tad   No facility-administered encounter medications on file as of 07/23/2016.     Past Medical History:  Diagnosis Date  . Allergy    drug  . Anxiety   . Arthritis    all joint  . Asthma   . Back pain   . Cervicalgia   . Chest pain, unspecified 2010  . Claustrophobia   . Complication of anesthesia    shortness of breath after ovarian surgery d/t face mask; needs nasal cannula d/t claustophobia  . Depression   . Dorsalgia   . GAD (generalized anxiety disorder)   . GERD (gastroesophageal reflux disease)   . Hemangioma   . Hyperlipidemia   . Hypertension   . Knee pain   . Right lumbar radiculopathy 07/23/2016  . Tobacco use disorder   . Ulcer ([place])   . Vitamin D deficiency     Past Surgical History:  Procedure Laterality Date  . ABDOMINAL HYSTERECTOMY     partial, bleeding and pain  . CARDIAC CATHETERIZATION  01/31/2009   NL coronaries, NL LVEF 55-60%, no MR (Dr. Sherren Mocha)  . CHOLECYSTECTOMY    . OVARIAN CYST REMOVAL    . SHOULDER ARTHROSCOPY WITH SUBACROMIAL  DECOMPRESSION AND OPEN ROTATOR C Right 02/02/2013   Procedure: RIGHT SHOULDER ARTHROSCOPY WITH SUBACROMIAL DECOMPRESSION AND  OPEN BICEP TENDON REPAIR AND DISTAL CLAVICLE RESECTION;  Surgeon: Augustin Schooling, MD;  Location: [facility];  Service: Orthopedics;  Laterality: Right;  . TUBAL LIGATION      Social History   Social History  . Marital status: Single    Spouse name: N/A  . Number of children: 3  . Years of education: 15   Occupational History  . phlebotomy    Social History Main Topics  . Smoking status: Current Every Day Smoker    Packs/day: 0.50    Years: 20.00    Types: Cigarettes    Start date: 05/25/1987  . Smokeless tobacco: Never Used  . Alcohol use No  .  Drug use: No  . Sexual activity: Yes    Birth control/ protection: Surgical   Other Topics Concern  . Not on file   Social History Narrative   Lives at home with BF Aaron Edelman (21 years)   Mother at home   Daughter and her BF   Granddaughter 7 months   adopted daughter Lance Muss 7 years    Family History  Problem Relation Age of Onset  . Heart disease Father 38    premature  . Diabetes Father   . Heart attack Father   . Alcohol abuse Father   . Arthritis Father   . Depression Father   . Hyperlipidemia Father   . Hypertension Father   . Stroke Father   . Arthritis Mother   . Hypertension Mother   . Hypercholesterolemia Mother   . Kidney disease Mother   . Kidney cancer Mother   . Alcohol abuse Mother   . Cancer Mother     kidney  . Hyperlipidemia Mother   . Mental illness Sister   . Alcohol abuse Sister   . Heart disease Brother 47  . Heart disease Paternal Grandfather     Review of Systems  Constitutional: Negative for chills and fever.  HENT: Negative for congestion, hearing loss and sinus pain.   Eyes: Negative for blurred vision and redness.  Respiratory: Negative for cough and shortness of breath.   Cardiovascular: Negative for chest pain and palpitations.  Gastrointestinal: Negative for constipation and diarrhea.  Genitourinary: Negative for dysuria and flank pain.  Musculoskeletal: Positive for back pain and myalgias. Negative for falls.  Neurological: Positive for tingling. Negative for dizziness, focal weakness and weakness.  Psychiatric/Behavioral: Positive for depression. Negative for substance abuse and suicidal ideas. The patient is nervous/anxious and has insomnia.     BP 130/88 (BP Location: Right Arm, Patient Position: Sitting, Cuff Size: Large)   Pulse 86   Temp 98.7 F (37.1 C) (Temporal)   Resp 18   Ht 5\' 9"  (1.753 m)   Wt 234 lb 0.6 oz (106.2 kg)   LMP 05/24/2002 (Approximate)   SpO2 98%   BMI 34.56 kg/m   Physical Exam  Constitutional:  She is oriented to person, place, and time. She appears well-developed and well-nourished. She appears distressed.  Frustrated.  Tearful.  Appears uncomfortable , moves frequently  HENT:  Head: Normocephalic and atraumatic.  Right Ear: External ear normal.  Left Ear: External ear normal.  Nose: Nose normal.  Mouth/Throat: Oropharynx is clear and moist.  Eyes: Conjunctivae are normal. Pupils are equal, round, and reactive to light.  Neck: Normal range of motion. Neck supple. No thyromegaly present.  Cardiovascular: Normal rate, regular rhythm and normal heart  sounds.   Pulmonary/Chest: Effort normal and breath sounds normal. She has no wheezes.  Abdominal: Soft. Bowel sounds are normal.  Musculoskeletal: Normal range of motion. She exhibits no edema.  Lumbar spine is straight and symmetric. limited range of motion. No tenderness or muscle spasm. Strength, sensation, range of motion, and reflexes are normal in both lower extremities. Straight leg raise is negative on the L and mildly pos in the right.  No tenderness to R greater trochanter   Lymphadenopathy:    She has no cervical adenopathy.  Neurological: She is alert and oriented to person, place, and time. She displays normal reflexes. Coordination normal.  Skin: Skin is warm and dry.  Psychiatric: Her speech is normal and behavior is normal. Judgment and thought content normal. Her mood appears anxious. Her affect is angry and labile. Cognition and memory are normal. She exhibits a depressed mood.   ASSESSMENT/PLAN:  1. Depression with anxiety Change to duloxetine  2. Osteoarthritis of lumbar spine, unspecified spinal osteoarthritis complication status - Ambulatory referral to Physical Therapy - Ambulatory referral to Neurosurgery  3. Chronic hip pain, right - DG HIP UNILAT WITH PELVIS 2-3 VIEWS RIGHT; Future  4. Lumbar spondylosis General pain and inability to take NSAID and acetaminophen.  Discussed alternatives  5. Right  lumbar radiculopathy Medrol pak  6. Encounter to establish care with new doctor   7. Need for influenza vaccination  - Flu Vaccine QUAD 36+ mos IM  8. Hyperlipidemia, unspecified hyperlipidemia type Will check lipids with next blood draw   Patient Instructions  1 stop sertraline    Start duloxetine    Can switch directly  2. Cyclobenzaprine is a muscle relaxer.  Take one or two at night     This will help with sleep     Can take during day but caution drowsiness  3. PT ordered  4. Neurosurgery consult placed.  We will try to get you seen within the month  5.  Take medrol pak.  This is a prednisone.  It can make you slightly jittery at first.  Call us for problems  6  Change to Lite cigarette  7. Hip x ray ordered  8. See me in 3 weeks  9. Need old records      Raylene Everts, MD

## 2016-07-23 NOTE — Patient Instructions (Signed)
1 stop sertraline    Start duloxetine    Can switch directly  2. Cyclobenzaprine is a muscle relaxer.  Take one or two at night     This will help with sleep     Can take during day but caution drowsiness  3. PT ordered  4. Neurosurgery consult placed.  We will try to get you seen within the month  5.  Take medrol pak.  This is a prednisone.  It can make you slightly jittery at first.  Call us for problems  6  Change to Lite cigarette  7. Hip x ray ordered  8. See me in 3 weeks  9. Need old records

## 2016-08-10 ENCOUNTER — Telehealth (HOSPITAL_COMMUNITY): Payer: Self-pay | Admitting: Family Medicine

## 2017-02-11 ENCOUNTER — Ambulatory Visit (HOSPITAL_COMMUNITY)
Admission: RE | Admit: 2017-02-11 | Discharge: 2017-02-11 | Disposition: A | Discharge status: Home / Self Care | Payer: 59 | Source: Ambulatory Visit | Attending: Internal Medicine | Admitting: Internal Medicine

## 2017-02-11 ENCOUNTER — Ambulatory Visit (HOSPITAL_COMMUNITY): Payer: 59

## 2017-02-11 ENCOUNTER — Other Ambulatory Visit (HOSPITAL_COMMUNITY): Payer: Self-pay | Admitting: Internal Medicine

## 2017-02-11 DIAGNOSIS — M25551 Pain in right hip: Secondary | ICD-10-CM | POA: Diagnosis not present

## 2017-02-11 DIAGNOSIS — M79604 Pain in right leg: Secondary | ICD-10-CM | POA: Diagnosis present

## 2017-06-09 ENCOUNTER — Ambulatory Visit (HOSPITAL_COMMUNITY)
Admission: RE | Admit: 2017-06-09 | Discharge: 2017-06-09 | Disposition: A | Discharge status: Home / Self Care | Payer: 59 | Source: Ambulatory Visit | Attending: Internal Medicine | Admitting: Internal Medicine

## 2017-06-09 ENCOUNTER — Other Ambulatory Visit (HOSPITAL_COMMUNITY): Payer: Self-pay | Admitting: Internal Medicine

## 2017-06-09 DIAGNOSIS — R059 Cough, unspecified: Secondary | ICD-10-CM

## 2017-06-09 DIAGNOSIS — R05 Cough: Secondary | ICD-10-CM | POA: Diagnosis not present

## 2017-08-01 ENCOUNTER — Encounter: Payer: Self-pay | Admitting: Family Medicine

## 2019-07-06 ENCOUNTER — Other Ambulatory Visit: Payer: Self-pay

## 2019-07-06 ENCOUNTER — Ambulatory Visit: Payer: 59 | Attending: Internal Medicine

## 2019-07-06 DIAGNOSIS — Z20822 Contact with and (suspected) exposure to covid-19: Secondary | ICD-10-CM | POA: Insufficient documentation

## 2019-07-07 LAB — NOVEL CORONAVIRUS, NAA: SARS-CoV-2, NAA: NOT DETECTED

## 2019-08-16 ENCOUNTER — Other Ambulatory Visit (HOSPITAL_COMMUNITY): Payer: Self-pay | Admitting: Family Medicine

## 2019-08-16 DIAGNOSIS — Z1231 Encounter for screening mammogram for malignant neoplasm of breast: Secondary | ICD-10-CM

## 2019-08-20 ENCOUNTER — Ambulatory Visit (HOSPITAL_COMMUNITY): Payer: Self-pay

## 2019-08-22 ENCOUNTER — Other Ambulatory Visit (HOSPITAL_COMMUNITY): Payer: Self-pay | Admitting: Family Medicine

## 2019-08-22 DIAGNOSIS — N6310 Unspecified lump in the right breast, unspecified quadrant: Secondary | ICD-10-CM

## 2019-09-11 ENCOUNTER — Inpatient Hospital Stay (HOSPITAL_COMMUNITY): Admission: RE | Admit: 2019-09-11 | Payer: Self-pay | Source: Ambulatory Visit

## 2019-09-11 ENCOUNTER — Ambulatory Visit (HOSPITAL_COMMUNITY): Admission: RE | Admit: 2019-09-11 | Payer: 59 | Source: Ambulatory Visit

## 2019-09-11 ENCOUNTER — Ambulatory Visit (HOSPITAL_COMMUNITY): Payer: 59 | Attending: Family Medicine

## 2020-10-10 ENCOUNTER — Other Ambulatory Visit (HOSPITAL_COMMUNITY): Payer: Self-pay | Admitting: Family Medicine

## 2020-10-10 ENCOUNTER — Ambulatory Visit (HOSPITAL_COMMUNITY)
Admission: RE | Admit: 2020-10-10 | Discharge: 2020-10-10 | Disposition: A | Discharge status: Home / Self Care | Payer: Managed Care, Other (non HMO) | Source: Ambulatory Visit | Attending: Family Medicine | Admitting: Family Medicine

## 2020-10-10 ENCOUNTER — Other Ambulatory Visit: Payer: Self-pay

## 2020-10-10 DIAGNOSIS — M79671 Pain in right foot: Secondary | ICD-10-CM | POA: Diagnosis not present

## 2021-01-16 ENCOUNTER — Ambulatory Visit: Payer: Managed Care, Other (non HMO) | Admitting: Allergy & Immunology

## 2022-04-23 ENCOUNTER — Emergency Department (HOSPITAL_COMMUNITY)
Admission: EM | Admit: 2022-04-23 | Discharge: 2022-04-23 | Disposition: A | Discharge status: Home / Self Care | Payer: No Typology Code available for payment source | Attending: Emergency Medicine | Admitting: Emergency Medicine

## 2022-04-23 ENCOUNTER — Encounter (HOSPITAL_COMMUNITY): Payer: Self-pay

## 2022-04-23 ENCOUNTER — Other Ambulatory Visit: Payer: Self-pay

## 2022-04-23 DIAGNOSIS — T782XXA Anaphylactic shock, unspecified, initial encounter: Secondary | ICD-10-CM | POA: Insufficient documentation

## 2022-04-23 DIAGNOSIS — J45909 Unspecified asthma, uncomplicated: Secondary | ICD-10-CM | POA: Diagnosis not present

## 2022-04-23 DIAGNOSIS — T7840XA Allergy, unspecified, initial encounter: Secondary | ICD-10-CM | POA: Diagnosis present

## 2022-04-23 LAB — CBC
HCT: 38.3 % (ref 36.0–46.0)
Hemoglobin: 13.3 g/dL (ref 12.0–15.0)
MCH: 30.3 pg (ref 26.0–34.0)
MCHC: 34.7 g/dL (ref 30.0–36.0)
MCV: 87.2 fL (ref 80.0–100.0)
Platelets: 159 10*3/uL (ref 150–400)
RBC: 4.39 MIL/uL (ref 3.87–5.11)
RDW: 12.1 % (ref 11.5–15.5)
WBC: 6.2 10*3/uL (ref 4.0–10.5)
nRBC: 0 % (ref 0.0–0.2)

## 2022-04-23 LAB — BASIC METABOLIC PANEL
Anion gap: 8 (ref 5–15)
BUN: 11 mg/dL (ref 6–20)
CO2: 27 mmol/L (ref 22–32)
Calcium: 8.7 mg/dL — ABNORMAL LOW (ref 8.9–10.3)
Chloride: 104 mmol/L (ref 98–111)
Creatinine, Ser: 0.79 mg/dL (ref 0.44–1.00)
GFR, Estimated: >60 mL/min (ref >60)
Glucose, Bld: 96 mg/dL (ref 70–99)
Potassium: 2.9 mmol/L — ABNORMAL LOW (ref 3.5–5.1)
Sodium: 139 mmol/L (ref 135–145)

## 2022-04-23 MED ORDER — SODIUM CHLORIDE 0.9 % IV SOLN: INTRAVENOUS | Status: DC

## 2022-04-23 MED ORDER — FAMOTIDINE IN NACL 20-0.9 MG/50ML-% IV SOLN
20.0000 mg | Freq: Once | INTRAVENOUS | Status: AC
  Administered 2022-04-23: 20 mg via INTRAVENOUS
  Filled 2022-04-23: qty 50

## 2022-04-23 MED ORDER — PREDNISONE 50 MG PO TABS: 50.0000 mg | ORAL_TABLET | Freq: Every day | ORAL | 0 refills | Status: DC

## 2022-04-23 MED ORDER — DIPHENHYDRAMINE HCL 50 MG/ML IJ SOLN
25.0000 mg | Freq: Once | INTRAMUSCULAR | Status: AC
  Administered 2022-04-23: 25 mg via INTRAVENOUS
  Filled 2022-04-23: qty 1

## 2022-04-23 MED ORDER — SODIUM CHLORIDE 0.9 % IV BOLUS
1000.0000 mL | Freq: Once | INTRAVENOUS | Status: AC
  Administered 2022-04-23: 1000 mL via INTRAVENOUS

## 2022-04-23 NOTE — ED Provider Notes (Signed)
[facility] DEPT Provider Note   CSN: [id number] Arrival date & time: 04/23/22  1423     History  Chief Complaint  Patient presents with   Allergic Reaction    Caitlyn Ray is a 46 y.o. female.  Pt is a 46 yo female with a pmhx significant for allergies, hld, anxiety, asthma, gerd, and arthritis.  Pt was at work today and was sitting next to a coworker who was eating something with cashews in it.  Pt developed sob and hives.  Pt was given an epi pen, zofran, and 80 mg solumedrol.  Pt was given the epi pen close to her knee joint on the left leg.  Pt had a brief loc after epi was given.  Pt feels itchy.         Home Medications Prior to Admission medications   Medication Sig Start Date End Date Taking? Authorizing Provider  predniSONE (DELTASONE) 50 MG tablet Take 1 tablet (50 mg total) by mouth daily with breakfast. 04/23/22  Yes Isla Pence, MD  atorvastatin (LIPITOR) 40 MG tablet Take 40 mg by mouth at bedtime. 06/15/16   [provider]  Cholecalciferol (VITAMIN D) 2000 UNITS CAPS Take 1 capsule by mouth daily.    [provider]  cyclobenzaprine (FLEXERIL) 5 MG tablet Take 1 tablet (5 mg total) by mouth 3 (three) times daily as needed for muscle spasms. 07/23/16   Raylene Everts, MD  DULoxetine (CYMBALTA) 30 MG capsule Take 1 capsule (30 mg total) by mouth daily. 07/23/16   Raylene Everts, MD  methylPREDNISolone (MEDROL DOSEPAK) 4 MG TBPK tablet tad 07/23/16   Raylene Everts, MD  XTAMPZA ER 13.5 MG C12A TAKE ONE CAPSULE BY MOUTH 4 TIMES A DAY 06/22/16   [provider]      Allergies    Aspirin, Cashew nut oil, Penicillins, Tramadol, Tylenol [acetaminophen], and Vancomycin    Review of Systems   Review of Systems  Skin:        hives  All other systems reviewed and are negative.   Physical Exam Updated Vital Signs BP 97/64   Pulse 75   Temp 98.1 F (36.7 C) (Oral)   Resp 12   Ht '5\' 10"'$  (1.778 m)    Wt 104.3 kg   LMP 05/24/2002 (Approximate)   SpO2 97%   BMI 33.00 kg/m  Physical Exam Vitals and nursing note reviewed.  Constitutional:      Appearance: Normal appearance.  HENT:     Head: Normocephalic and atraumatic.     Right Ear: External ear normal.     Left Ear: External ear normal.     Nose: Nose normal.     Mouth/Throat:     Mouth: Mucous membranes are moist.     Pharynx: Oropharynx is clear.  Eyes:     Conjunctiva/sclera: Conjunctivae normal.     Pupils: Pupils are equal, round, and reactive to light.  Cardiovascular:     Rate and Rhythm: Normal rate and regular rhythm.     Pulses: Normal pulses.  Pulmonary:     Effort: Pulmonary effort is normal.     Breath sounds: Normal breath sounds.  Abdominal:     General: Abdomen is flat. Bowel sounds are normal.     Palpations: Abdomen is soft.  Musculoskeletal:        General: Normal range of motion.     Cervical back: Normal range of motion and neck supple.  Skin:  General: Skin is warm.     Capillary Refill: Capillary refill takes less than 2 seconds.     Comments: hives  Neurological:     General: No focal deficit present.     Mental Status: She is alert and oriented to person, place, and time.  Psychiatric:        Mood and Affect: Mood normal.        Behavior: Behavior normal.     ED Results / Procedures / Treatments   Labs (all labs ordered are listed, but only abnormal results are displayed) Labs Reviewed  BASIC METABOLIC PANEL - Abnormal; Notable for the following components:      Result Value   Potassium 2.9 (*)    Calcium 8.7 (*)    All other components within normal limits  CBC    EKG EKG Interpretation  Date/Time:  Friday April 23 2022 14:51:55 EST Ventricular Rate:  74 PR Interval:  176 QRS Duration: 102 QT Interval:  410 QTC Calculation: 455 R Axis:   -25 Text Interpretation: Sinus rhythm Borderline left axis deviation No significant change since last tracing Confirmed by  Isla Pence [phone]) on 04/23/2022 3:31:06 PM  Radiology No results found.  Procedures Procedures    Medications Ordered in ED Medications  sodium chloride 0.9 % bolus 1,000 mL (0 mLs Intravenous Stopped 04/23/22 1634)    And  0.9 %  sodium chloride infusion ( Intravenous New Bag/Given 04/23/22 1638)  diphenhydrAMINE (BENADRYL) injection 25 mg (25 mg Intravenous Given 04/23/22 1500)  famotidine (PEPCID) IVPB 20 mg premix (0 mg Intravenous Stopped 04/23/22 1544)    ED Course/ Medical Decision Making/ A&P                           Medical Decision Making Amount and/or Complexity of Data Reviewed Labs: ordered.  Risk Prescription drug management.   This patient presents to the ED for concern of allergic rxn, this involves an extensive number of treatment options, and is a complaint that carries with it a high risk of complications and morbidity.  The differential diagnosis includes anaphylaxis, allergic rxn   Co morbidities that complicate the patient evaluation  allergies, hld, anxiety, asthma, gerd, and arthritis   Additional history obtained:  Additional history obtained from epic chart review External records from outside source obtained and reviewed including EMS report   Lab Tests:  I Ordered, and personally interpreted labs.  The pertinent results include:  cbc nl; bmp with k low at 2.9  Cardiac Monitoring:  The patient was maintained on a cardiac monitor.  I personally viewed and interpreted the cardiac monitored which showed an underlying rhythm of: nsr   Medicines ordered and prescription drug management:  I ordered medication including benadryl and pepcid  for allergy  Reevaluation of the patient after these medicines showed that the patient improved I have reviewed the patients home medicines and have made adjustments as needed    Critical Interventions:  benadryl  Problem List / ED Course:  Anaphylaxis:  pt is feeling much better.  She has  been watched for 4 hrs since epi pen.  She looks good.  Pt is stable for d/c.  Return if worse.    Reevaluation:  After the interventions noted above, I reevaluated the patient and found that they have :improved   Social Determinants of Health:  Lives at home   Dispostion:  After consideration of the diagnostic results and the patients response to  treatment, I feel that the patent would benefit from discharge with outpatient f/u.          Final Clinical Impression(s) / ED Diagnoses Final diagnoses:  Anaphylaxis, initial encounter    Rx / DC Orders ED Discharge Orders          Ordered    predniSONE (DELTASONE) 50 MG tablet  Daily with breakfast        04/23/22 1618              Isla Pence, MD 04/23/22 1653

## 2022-04-23 NOTE — ED Triage Notes (Signed)
Pt arrived via EMS, from work. Allergic reaction to cashews, experienced some sudden SOB. Given epi IM, neb tx, zofran '4mg'$ , 80 mg solumedrol PTA  Staff stated brief episode of LOC after epi given.

## 2023-01-26 ENCOUNTER — Other Ambulatory Visit (HOSPITAL_COMMUNITY): Payer: Self-pay | Admitting: Family Medicine

## 2023-01-26 DIAGNOSIS — N644 Mastodynia: Secondary | ICD-10-CM

## 2023-01-26 DIAGNOSIS — R079 Chest pain, unspecified: Secondary | ICD-10-CM

## 2023-01-31 ENCOUNTER — Ambulatory Visit (HOSPITAL_COMMUNITY)
Admission: RE | Admit: 2023-01-31 | Discharge: 2023-01-31 | Disposition: A | Discharge status: Home / Self Care | Payer: Managed Care, Other (non HMO) | Source: Ambulatory Visit | Attending: Family Medicine | Admitting: Family Medicine

## 2023-01-31 DIAGNOSIS — R079 Chest pain, unspecified: Secondary | ICD-10-CM | POA: Diagnosis present

## 2023-01-31 DIAGNOSIS — E785 Hyperlipidemia, unspecified: Secondary | ICD-10-CM | POA: Insufficient documentation

## 2023-01-31 LAB — ECHOCARDIOGRAM COMPLETE
AR max vel: 2.94 cm2
AV Area VTI: 2.93 cm2
AV Area mean vel: 2.9 cm2
AV Mean grad: 5 mmHg
AV Peak grad: 8.4 mmHg
Ao pk vel: 1.45 m/s
Area-P 1/2: 3.93 cm2
S' Lateral: 2.6 cm

## 2023-02-22 ENCOUNTER — Encounter (HOSPITAL_COMMUNITY): Payer: Self-pay

## 2023-02-22 ENCOUNTER — Ambulatory Visit (HOSPITAL_COMMUNITY)
Admission: RE | Admit: 2023-02-22 | Discharge: 2023-02-22 | Disposition: A | Discharge status: Home / Self Care | Payer: Managed Care, Other (non HMO) | Source: Ambulatory Visit | Attending: Family Medicine | Admitting: Family Medicine

## 2023-02-22 DIAGNOSIS — N644 Mastodynia: Secondary | ICD-10-CM

## 2023-03-08 ENCOUNTER — Telehealth (HOSPITAL_COMMUNITY): Payer: Self-pay

## 2023-03-08 NOTE — Telephone Encounter (Signed)
Lvm to confirm 03/10/23 appt by 12:00 03/09/23

## 2023-03-10 ENCOUNTER — Ambulatory Visit (HOSPITAL_COMMUNITY): Payer: Managed Care, Other (non HMO) | Admitting: Psychiatry

## 2023-03-29 ENCOUNTER — Telehealth (HOSPITAL_COMMUNITY): Payer: Self-pay

## 2023-03-29 NOTE — Telephone Encounter (Signed)
Lvm to confirm 03/31/23 appt by 12:00 03/30/23

## 2023-03-30 ENCOUNTER — Encounter (HOSPITAL_COMMUNITY): Payer: Self-pay

## 2023-03-30 NOTE — Telephone Encounter (Signed)
Called pt to confirm no answer left vm letting that her know that we will cancel and she will need to call to r/s due to not confirming appt automated system tried 03/24/23 and 03/30/23 with no response sending mychart to r/s

## 2023-03-31 ENCOUNTER — Ambulatory Visit (HOSPITAL_COMMUNITY): Payer: Managed Care, Other (non HMO) | Admitting: Psychiatry

## 2023-05-26 ENCOUNTER — Encounter: Payer: Self-pay | Admitting: Advanced Practice Midwife

## 2023-06-16 ENCOUNTER — Encounter: Payer: Managed Care, Other (non HMO) | Admitting: Adult Health

## 2023-07-25 ENCOUNTER — Other Ambulatory Visit (HOSPITAL_COMMUNITY): Payer: Self-pay | Admitting: Family Medicine

## 2023-07-25 DIAGNOSIS — N6489 Other specified disorders of breast: Secondary | ICD-10-CM

## 2023-09-13 ENCOUNTER — Encounter (HOSPITAL_COMMUNITY): Payer: Self-pay

## 2023-09-13 ENCOUNTER — Encounter (HOSPITAL_COMMUNITY)

## 2023-09-13 ENCOUNTER — Ambulatory Visit (HOSPITAL_COMMUNITY): Attending: Family Medicine

## 2024-01-08 ENCOUNTER — Encounter (HOSPITAL_COMMUNITY): Payer: Self-pay

## 2024-01-08 ENCOUNTER — Emergency Department (HOSPITAL_COMMUNITY)
Admission: EM | Admit: 2024-01-08 | Discharge: 2024-01-09 | Disposition: A | Discharge status: Home / Self Care | Attending: Emergency Medicine | Admitting: Emergency Medicine

## 2024-01-08 ENCOUNTER — Other Ambulatory Visit: Payer: Self-pay

## 2024-01-08 DIAGNOSIS — S46912A Strain of unspecified muscle, fascia and tendon at shoulder and upper arm level, left arm, initial encounter: Secondary | ICD-10-CM

## 2024-01-08 DIAGNOSIS — M25512 Pain in left shoulder: Secondary | ICD-10-CM | POA: Diagnosis present

## 2024-01-08 DIAGNOSIS — X501XXA Overexertion from prolonged static or awkward postures, initial encounter: Secondary | ICD-10-CM | POA: Diagnosis not present

## 2024-01-08 NOTE — ED Triage Notes (Signed)
 Complaining of pain and tingling in the left shoulder that started yesterday after reaching for a box. Pain started in the left side of neck and radiates down the arm to the finger tips.

## 2024-01-09 ENCOUNTER — Emergency Department (HOSPITAL_COMMUNITY)

## 2024-01-09 MED ORDER — PREDNISONE 20 MG PO TABS
40.0000 mg | ORAL_TABLET | Freq: Once | ORAL | Status: AC
  Administered 2024-01-09: 40 mg via ORAL
  Filled 2024-01-09: qty 2

## 2024-01-09 MED ORDER — PREDNISONE 10 MG PO TABS: 20.0000 mg | ORAL_TABLET | Freq: Two times a day (BID) | ORAL | 0 refills | Status: AC

## 2024-01-09 NOTE — ED Provider Notes (Signed)
 [facility] EMERGENCY DEPARTMENT AT [facility] Provider Note   CSN: [id number] Arrival date & time: 01/08/24  2344     Patient presents with: Shoulder Injury   Caitlyn Ray is a 48 y.o. female.   Patient is a 48 year old female presenting with complaints of left shoulder pain.  She reports bending over yesterday to pick up a box at [employer] and felt a pull in her shoulder.  She has been experiencing pain radiating down her arm since.  She denies any weakness or numbness.  She reports a history of prior surgery for a labral tear in her right shoulder and this feels similar.  She has been taking oxycodone  at home with little relief.       Prior to Admission medications   Medication Sig Start Date End Date Taking? Authorizing Provider  atorvastatin (LIPITOR) 40 MG tablet Take 40 mg by mouth at bedtime. 06/15/16   [provider]  Cholecalciferol (VITAMIN D) 2000 UNITS CAPS Take 1 capsule by mouth daily.    [provider]  cyclobenzaprine  (FLEXERIL ) 5 MG tablet Take 1 tablet (5 mg total) by mouth 3 (three) times daily as needed for muscle spasms. 07/23/16   Maranda Jamee Jacob, MD  DULoxetine  (CYMBALTA ) 30 MG capsule Take 1 capsule (30 mg total) by mouth daily. 07/23/16   Maranda Jamee Jacob, MD  methylPREDNISolone  (MEDROL  DOSEPAK) 4 MG TBPK tablet tad 07/23/16   Maranda Jamee Jacob, MD  predniSONE  (DELTASONE ) 50 MG tablet Take 1 tablet (50 mg total) by mouth daily with breakfast. 04/23/22   Dean Clarity, MD  XTAMPZA  ER 13.5 MG C12A TAKE ONE CAPSULE BY MOUTH 4 TIMES A DAY 06/22/16   [provider]    Allergies: Aspirin, Cashew nut oil, Penicillins, Tramadol , Tylenol  [acetaminophen ], and Vancomycin    Review of Systems  All other systems reviewed and are negative.   Updated Vital Signs BP (!) 136/92 (BP Location: Right Arm)   Pulse 77   Temp 98.5 F (36.9 C) (Oral)   Resp 18   Ht 5' 10 (1.778 m)   Wt 104.3 kg   LMP 05/24/2002 (Approximate)    SpO2 98%   BMI 33.00 kg/m   Physical Exam Vitals and nursing note reviewed.  Constitutional:      Appearance: Normal appearance.  Pulmonary:     Effort: Pulmonary effort is normal.  Musculoskeletal:     Comments: The left shoulder appears grossly normal.  There is tenderness to palpation over the posterior aspect of the [facility] joint, but no palpable abnormality is noted.  She has pain with any range of motion.  Ulnar and radial pulses are easily palpable and motor and sensation are intact throughout the entire arm.  Skin:    General: Skin is warm and dry.  Neurological:     Mental Status: She is alert and oriented to person, place, and time.     (all labs ordered are listed, but only abnormal results are displayed) Labs Reviewed - No data to display  EKG: None  Radiology: DG Shoulder Left Port Result Date: 01/09/2024 CLINICAL DATA:  Pain and tingling in the left shoulder. EXAM: LEFT SHOULDER COMPARISON:  None Available. FINDINGS: There is no evidence of fracture or dislocation. There is no evidence of arthropathy or other focal bone abnormality. Soft tissues are unremarkable. IMPRESSION: Negative. Electronically Signed   By: Norman Gatlin M.D.   On: 01/09/2024 00:23     Procedures   Medications Ordered in the ED  predniSONE  (DELTASONE ) tablet 40 mg (has no administration in time range)                                    Medical Decision Making Amount and/or Complexity of Data Reviewed Radiology: ordered.  Risk Prescription drug management.   Patient presenting with left shoulder pain as described in the HPI.  X-rays are negative for dislocation or other abnormality.  She has history of prior labral tear in the other shoulder and states this feels the same.  Patient will be treated with a course of prednisone , continued use of her Percocet at home, and an arm sling.  She is to follow-up with primary doctor if not improving in the next week.     Final diagnoses:  None     ED Discharge Orders     None          Geroldine Berg, MD 01/09/24 [phone]

## 2024-01-09 NOTE — Discharge Instructions (Signed)
 Begin taking prednisone  as prescribed.  Continue taking oxycodone  as previously prescribed as needed for pain.  Wear arm sling for comfort and support.  Follow-up with primary doctor if not improving in the next week.

## 2024-03-13 ENCOUNTER — Ambulatory Visit: Admitting: Cardiology
